# Patient Record
Sex: Female | Born: 1975 | Race: White | Hispanic: No | Marital: Married | Smoking: Former smoker
Health system: Southern US, Community
[De-identification: ages and names within clinical notes are randomized; demographics above are authoritative.]

## PROBLEM LIST (undated history)

## (undated) DIAGNOSIS — N289 Disorder of kidney and ureter, unspecified: Secondary | ICD-10-CM

## (undated) DIAGNOSIS — C50919 Malignant neoplasm of unspecified site of unspecified female breast: Secondary | ICD-10-CM

## (undated) DIAGNOSIS — G43909 Migraine, unspecified, not intractable, without status migrainosus: Secondary | ICD-10-CM

## (undated) HISTORY — PX: APPENDECTOMY: SHX54

## (undated) HISTORY — PX: INCONTINENCE SURGERY: SHX676

## (undated) HISTORY — PX: ABDOMINAL HYSTERECTOMY: SHX81

## (undated) HISTORY — PX: BREAST SURGERY: SHX581

---

## 2004-07-24 ENCOUNTER — Inpatient Hospital Stay: Payer: Self-pay | Admitting: Urology

## 2006-05-12 ENCOUNTER — Ambulatory Visit: Payer: Self-pay | Admitting: Urology

## 2006-05-13 ENCOUNTER — Ambulatory Visit: Payer: Self-pay | Admitting: Urology

## 2006-06-23 ENCOUNTER — Ambulatory Visit: Payer: Self-pay | Admitting: Unknown Physician Specialty

## 2006-07-03 ENCOUNTER — Ambulatory Visit: Payer: Self-pay | Admitting: Unknown Physician Specialty

## 2007-09-01 ENCOUNTER — Emergency Department: Payer: Self-pay | Admitting: Internal Medicine

## 2008-08-16 ENCOUNTER — Ambulatory Visit: Payer: Self-pay | Admitting: Unknown Physician Specialty

## 2013-02-03 DIAGNOSIS — C50919 Malignant neoplasm of unspecified site of unspecified female breast: Secondary | ICD-10-CM | POA: Insufficient documentation

## 2014-07-14 ENCOUNTER — Emergency Department
Admission: EM | Admit: 2014-07-14 | Discharge: 2014-07-14 | Disposition: A | Discharge status: Home / Self Care | Payer: 59 | Source: Home / Self Care | Attending: Emergency Medicine | Admitting: Emergency Medicine

## 2014-07-14 ENCOUNTER — Encounter: Payer: Self-pay | Admitting: Emergency Medicine

## 2014-07-14 DIAGNOSIS — N12 Tubulo-interstitial nephritis, not specified as acute or chronic: Secondary | ICD-10-CM

## 2014-07-14 DIAGNOSIS — N23 Unspecified renal colic: Secondary | ICD-10-CM

## 2014-07-14 DIAGNOSIS — R319 Hematuria, unspecified: Secondary | ICD-10-CM | POA: Diagnosis not present

## 2014-07-14 HISTORY — DX: Malignant neoplasm of unspecified site of unspecified female breast: C50.919

## 2014-07-14 HISTORY — DX: Disorder of kidney and ureter, unspecified: N28.9

## 2014-07-14 LAB — POCT URINALYSIS DIP (MANUAL ENTRY)
Bilirubin, UA: NEGATIVE
Glucose, UA: NEGATIVE
Nitrite, UA: POSITIVE
Protein Ur, POC: 100
Spec Grav, UA: >=1.03 (ref 1.005–1.03)
Urobilinogen, UA: 0.2 (ref 0–1)
pH, UA: 5.5 (ref 5–8)

## 2014-07-14 MED ORDER — HYDROCODONE-ACETAMINOPHEN 5-325 MG PO TABS: 1.0000 | ORAL_TABLET | ORAL | Status: DC | PRN

## 2014-07-14 MED ORDER — CEFTRIAXONE SODIUM 250 MG IJ SOLR
500.0000 mg | Freq: Once | INTRAMUSCULAR | Status: AC
  Administered 2014-07-14: 500 mg via INTRAMUSCULAR

## 2014-07-14 MED ORDER — CIPROFLOXACIN HCL 500 MG PO TABS: 500.0000 mg | ORAL_TABLET | Freq: Two times a day (BID) | ORAL | Status: DC

## 2014-07-14 NOTE — ED Notes (Signed)
Hematuria, clots, LBP started about 2am

## 2014-07-14 NOTE — Discharge Instructions (Signed)
Pyelonephritis, Adult Pyelonephritis is a kidney infection. In general, there are 2 main types of pyelonephritis:  Infections that come on quickly without any warning (acute pyelonephritis).  Infections that persist for a long period of time (chronic pyelonephritis). CAUSES  Two main causes of pyelonephritis are:  Bacteria traveling from the bladder to the kidney. This is a problem especially in pregnant women. The urine in the bladder can become filled with bacteria from multiple causes, including:  Inflammation of the prostate gland (prostatitis).  Sexual intercourse in females.  Bladder infection (cystitis).  Bacteria traveling from the bloodstream to the tissue part of the kidney. Problems that may increase your risk of getting a kidney infection include:  Diabetes.  Kidney stones or bladder stones.  Cancer.  Catheters placed in the bladder.  Other abnormalities of the kidney or ureter. SYMPTOMS   Abdominal pain.  Pain in the side or flank area.  Fever.  Chills.  Upset stomach.  Blood in the urine (dark urine).  Frequent urination.  Strong or persistent urge to urinate.  Burning or stinging when urinating. DIAGNOSIS  Your caregiver may diagnose your kidney infection based on your symptoms. A urine sample may also be taken. TREATMENT  In general, treatment depends on how severe the infection is.   If the infection is mild and caught early, your caregiver may treat you with oral antibiotics and send you home.  If the infection is more severe, the bacteria may have gotten into the bloodstream. This will require intravenous (IV) antibiotics and a hospital stay. Symptoms may include:  High fever.  Severe flank pain.  Shaking chills.  Even after a hospital stay, your caregiver may require you to be on oral antibiotics for a period of time.  Other treatments may be required depending upon the cause of the infection. HOME CARE INSTRUCTIONS   Take your  antibiotics as directed. Finish them even if you start to feel better.  Make an appointment to have your urine checked to make sure the infection is gone.  Drink enough fluids to keep your urine clear or pale yellow.  Take medicines for the bladder if you have urgency and frequency of urination as directed by your caregiver. SEEK IMMEDIATE MEDICAL CARE IF:   You have a fever or persistent symptoms for more than 2-3 days.  You have a fever and your symptoms suddenly get worse.  You are unable to take your antibiotics or fluids.  You develop shaking chills.  You experience extreme weakness or fainting.  There is no improvement after 2 days of treatment. MAKE SURE YOU:  Understand these instructions.  Will watch your condition.  Will get help right away if you are not doing well or get worse. Document Released: 04/01/2005 Document Revised: 10/01/2011 Document Reviewed: 09/05/2010 [facility] Patient Information 2015 [place], [place]. This information is not intended to replace advice given to you by your health care provider. Make sure you discuss any questions you have with your health care provider.  Today, we treated with Rocephin (antibiotic) 500 mg shot. New Prescriptions   CIPROFLOXACIN (CIPRO) 500 MG TABLET    Take 1 tablet (500 mg total) by mouth 2 (two) times daily. Take for 10 days   HYDROCODONE-ACETAMINOPHEN (NORCO/VICODIN) 5-325 MG PER TABLET    Take 1-2 tablets by mouth every 4 (four) hours as needed for severe pain. Take with food.   You need to follow up with your urologist in 1-2 days. If any severe worsening symptoms, go to emergency room  immediately.

## 2014-07-14 NOTE — ED Provider Notes (Addendum)
CSN: [id number]     Arrival date & time 07/14/14  1627 History   First MD Initiated Contact with Patient 07/14/14 1656     Chief Complaint  Patient presents with  . Hematuria    HPI This is a 39 y.o. female who presents today with UTI symptoms for 1 day. + dysuria + frequency + urgency Positive hematuria No vaginal discharge Positive for mild fever/chills Positive for moderate right flank pain and right lower quadrant and suprapubic pain . No nausea No vomiting No midline back pain No fatigue She denies chance of pregnancy.--Status post hysterectomy 10 years ago Has tried over-the-counter AZO  without improvement.  She has a long history of recurrent UTIs for 20 years with negative urology workup in the past. Last time she saw urologist was at [facility] in [place] about 4 years ago.  She is able to tolerate by mouth's.    Past Medical History  Diagnosis Date  . Renal disorder   . Breast cancer    Past Surgical History  Procedure Laterality Date  . Breast surgery     History reviewed. No pertinent family history. History  Substance Use Topics  . Smoking status: Former Research scientist (life sciences)  . Smokeless tobacco: Not on file  . Alcohol Use: Yes   OB History    No data available     Review of Systems  All other systems reviewed and are negative.   Allergies  Penicillins and Sulfa antibiotics  Home Medications   Prior to Admission medications   Medication Sig Start Date End Date Taking? Authorizing Provider  tamoxifen (NOLVADEX) 20 MG tablet Take 20 mg by mouth daily.   Yes Historical Provider, MD  ciprofloxacin (CIPRO) 500 MG tablet Take 1 tablet (500 mg total) by mouth 2 (two) times daily. Take for 10 days 07/14/14   Jacqulyn Cane, MD  HYDROcodone-acetaminophen (NORCO/VICODIN) 5-325 MG per tablet Take 1-2 tablets by mouth every 4 (four) hours as needed for severe pain. Take with food. 07/14/14   Jacqulyn Cane, MD   BP 122/80 mmHg  Pulse 85  Temp(Src) 98 F (36.7  C) (Oral)  Ht 5\' 4"  (1.626 m)  Wt 140 lb (63.504 kg)  BMI 24.02 kg/m2  SpO2 99% Physical Exam  Constitutional: She is oriented to person, place, and time. She appears well-developed and well-nourished. No distress.  Alert female, cooperative. Mildly uncomfortable but in no acute distress  HENT:  Mouth/Throat: Oropharynx is clear and moist.  Eyes: Pupils are equal, round, and reactive to light. No scleral icterus.  Neck: Neck supple.  Cardiovascular: Normal rate, regular rhythm and normal heart sounds.   Pulmonary/Chest: Breath sounds normal.  Abdominal: Soft. Bowel sounds are normal. She exhibits no distension and no mass. There is no hepatosplenomegaly or hepatomegaly. There is tenderness in the suprapubic area. There is CVA tenderness (Right). There is no rigidity, no rebound, no guarding, no tenderness at McBurney's point and negative Murphy's sign.  Musculoskeletal:  Extremities without cyanosis clubbing or edema  Lymphadenopathy:    She has no cervical adenopathy.  Neurological: She is alert and oriented to person, place, and time.  Skin: Skin is warm and dry. No rash noted. She is not diaphoretic.  Nursing note and vitals reviewed.   ED Course  Procedures (including critical care time) Labs Review Labs Reviewed  URINE CULTURE  POCT URINALYSIS DIP (MANUAL ENTRY)   Results for orders placed or performed during the hospital encounter of 07/14/14  POCT urinalysis dipstick (new)  Result Value  Ref Range   Color, UA yellow    Clarity, UA hazy    Glucose, UA neg    Bilirubin, UA negative    Bilirubin, UA trace (5)    Spec Grav, UA >=1.030 1.005 - 1.03   Blood, UA large    pH, UA 5.5 5 - 8   Protein Ur, POC =100    Urobilinogen, UA 0.2 0 - 1   Nitrite, UA Positive    Leukocytes, UA small (1+)      Imaging Review No results found.   MDM   1. Pyelonephritis   2. Hematuria   3. Renal colic on right side    right pyelonephritis. She has stable vital signs and no  clinical evidence of sepsis and in my opinion can be treated as an outpatient with very close follow-up within 1-2 days with her urologist.  I'm avoiding Flomax, because of potential for severe side effects in someone who is allergic to sulfa.  Treatment options discussed, as well as risks, benefits, alternatives. Patient voiced understanding and agreement with the following plans: Carefully reviewed that she has taken Keflex and other cephalosporins over the years without any side effects whatsoever.--(She has a history of rash from penicillin as a child) Rocephin 500 mg IM stat. He was observed and monitored closely for 30 minutes without any side effects. Urine strainer provided and advised to strain all urines. Push clear liquids New Prescriptions   CIPROFLOXACIN (CIPRO) 500 MG TABLET    Take 1 tablet (500 mg total) by mouth 2 (two) times daily. Take for 10 days   HYDROCODONE-ACETAMINOPHEN (NORCO/VICODIN) 5-325 MG PER TABLET    Take 1-2 tablets by mouth every 4 (four) hours as needed for severe pain. Take with food.   Urine culture sent. He declined any imaging or other testing at this time. Follow-up with your urologist in 1-2 days , or sooner if symptoms become worse. Precautions discussed. Red flags discussed.--Emergency room if any red flag Questions invited and answered. Patient voiced understanding and agreement. An After Visit Summary was printed and given to the patient.   Jacqulyn Cane, MD 07/14/14 1711  Jacqulyn Cane, MD 07/14/14 [phone]

## 2014-07-17 LAB — URINE CULTURE: Colony Count: >=100000

## 2014-07-18 ENCOUNTER — Telehealth: Payer: Self-pay | Admitting: Emergency Medicine

## 2014-10-08 ENCOUNTER — Encounter: Payer: Self-pay | Admitting: Emergency Medicine

## 2014-10-08 ENCOUNTER — Emergency Department (INDEPENDENT_AMBULATORY_CARE_PROVIDER_SITE_OTHER): Payer: 59

## 2014-10-08 ENCOUNTER — Emergency Department
Admission: EM | Admit: 2014-10-08 | Discharge: 2014-10-08 | Disposition: A | Discharge status: Home / Self Care | Payer: 59 | Source: Home / Self Care | Attending: Family Medicine | Admitting: Family Medicine

## 2014-10-08 DIAGNOSIS — X58XXXA Exposure to other specified factors, initial encounter: Secondary | ICD-10-CM

## 2014-10-08 DIAGNOSIS — S93402A Sprain of unspecified ligament of left ankle, initial encounter: Secondary | ICD-10-CM

## 2014-10-08 DIAGNOSIS — S92352A Displaced fracture of fifth metatarsal bone, left foot, initial encounter for closed fracture: Secondary | ICD-10-CM

## 2014-10-08 DIAGNOSIS — S92355A Nondisplaced fracture of fifth metatarsal bone, left foot, initial encounter for closed fracture: Secondary | ICD-10-CM

## 2014-10-08 MED ORDER — MELOXICAM 15 MG PO TABS: 15.0000 mg | ORAL_TABLET | Freq: Every day | ORAL | Status: DC

## 2014-10-08 MED ORDER — HYDROCODONE-ACETAMINOPHEN 5-325 MG PO TABS: 1.0000 | ORAL_TABLET | ORAL | Status: DC | PRN

## 2014-10-08 MED ORDER — KETOROLAC TROMETHAMINE 30 MG/ML IJ SOLN
60.0000 mg | Freq: Once | INTRAMUSCULAR | Status: AC
  Administered 2014-10-08: 60 mg via INTRAMUSCULAR

## 2014-10-08 MED ORDER — KETOROLAC TROMETHAMINE 30 MG/ML IJ SOLN: 60.0000 mg | Freq: Once | INTRAMUSCULAR | Status: DC

## 2014-10-08 NOTE — Discharge Instructions (Signed)
Apply ice pack for 30 minutes every 1 to 2 hours today and tomorrow.  Wear ace wrap to minimize swelling.  Elevate as much as possible.  Use crutches until evaluated by sports medicine physician. Wear cam walker when walking.  Recommend taking 600units vitamin D and 1000mg  calcium daily.

## 2014-10-08 NOTE — ED Notes (Signed)
Patient states she stepped into a hole today and twisted the left ankle. Pain 10/10 sharp with movement and aching pain. Edema noted.

## 2014-10-08 NOTE — ED Provider Notes (Signed)
CSN: [id number]     Arrival date & time 10/08/14  1133 History   First MD Initiated Contact with Patient 10/08/14 1205     Chief Complaint  Patient presents with  . Ankle Pain     HPI Comments: Approximately two hours prior while playing golf, patient stepped into a hole and twisted her left ankle and foot.  She heard/felt a "pop" and had immediate pain/swelling.  She has pain with weight bearing.  Patient is a 39 y.o. female presenting with ankle pain. The history is provided by the patient and the spouse.  Ankle Pain Location:  Foot and ankle Time since incident:  2 hours Injury: yes   Mechanism of injury comment:  Inverted ankle Ankle location:  L ankle Foot location:  L foot Pain details:    Quality:  Aching   Radiates to:  Does not radiate   Severity:  Moderate   Onset quality:  Sudden   Duration:  2 hours   Timing:  Constant   Progression:  Unchanged Chronicity:  New Prior injury to area:  No Relieved by:  None tried Worsened by:  Bearing weight Ineffective treatments:  Ice Associated symptoms: decreased ROM, stiffness and swelling   Associated symptoms: no back pain, no muscle weakness, no numbness and no tingling     Past Medical History  Diagnosis Date  . Renal disorder   . Breast cancer    Past Surgical History  Procedure Laterality Date  . Breast surgery    . Appendectomy    . Abdominal hysterectomy    . Incontinence surgery     History reviewed. No pertinent family history. History  Substance Use Topics  . Smoking status: Former Research scientist (life sciences)  . Smokeless tobacco: Not on file  . Alcohol Use: Yes   OB History    No data available     Review of Systems  Musculoskeletal: Positive for stiffness. Negative for back pain.  All other systems reviewed and are negative.   Allergies  Bactrim; Penicillins; and Sulfa antibiotics  Home Medications   Prior to Admission medications   Medication Sig Start Date End Date Taking? Authorizing Provider    ciprofloxacin (CIPRO) 500 MG tablet Take 1 tablet (500 mg total) by mouth 2 (two) times daily. Take for 10 days 07/14/14   Jacqulyn Cane, MD  HYDROcodone-acetaminophen (NORCO/VICODIN) 5-325 MG per tablet Take 1-2 tablets by mouth every 4 (four) hours as needed for severe pain. Take with food. 10/08/14   Kandra Nicolas, MD  meloxicam (MOBIC) 15 MG tablet Take 1 tablet (15 mg total) by mouth daily. Take with food each morning 10/08/14   Kandra Nicolas, MD  tamoxifen (NOLVADEX) 20 MG tablet Take 20 mg by mouth daily.    Historical Provider, MD   BP 129/85 mmHg  Pulse 86  Temp(Src) 98.6 F (37 C) (Oral)  Resp 16  Ht 5\' 4"  (1.626 m)  SpO2 99% Physical Exam  Constitutional: She is oriented to person, place, and time. She appears well-developed and well-nourished. No distress.  HENT:  Head: Normocephalic.  Eyes: Pupils are equal, round, and reactive to light.  Musculoskeletal:       Left ankle: She exhibits decreased range of motion and swelling. She exhibits no ecchymosis, no deformity, no laceration and normal pulse. Tenderness. Lateral malleolus and AITFL tenderness found. No medial malleolus, no CF ligament, no posterior TFL, no head of 5th metatarsal and no proximal fibula tenderness found. Achilles tendon normal.  Feet:  Left ankle:  Decreased range of motion.  Tenderness and swelling over the lateral malleolus.  Joint stable.  There is tenderness to palpation over the distal fifth metatarsal. Distal neurovascular function is intact.  There is also tenderness over the course of the left peroneal tendon and base of 5th metatarsal.  Pain is elicited with resisted eversion and resisted plantar flexion of the ankle.  Distal neurovascular function is intact.     Neurological: She is alert and oriented to person, place, and time.  Skin: Skin is warm and dry.  Nursing note and vitals reviewed.   ED Course  Procedures  none  Imaging Review Dg Ankle Complete Left  10/08/2014   CLINICAL  DATA:  Left ankle injury, acute pain, difficulty bearing weight. Next illness  EXAM: LEFT ANKLE COMPLETE - 3+ VIEW  COMPARISON:  None.  FINDINGS: Normal left ankle alignment. Distal tibia, fibula, talus and calcaneus appear intact.  Lateral foot soft tissue swelling evident. Oblique lucency through the left fifth metatarsal distally is evident suspicious for a nondisplaced fracture. Recommend dedicated left foot series for further evaluation. Other metatarsals appear intact.  IMPRESSION: Findings suspicious for a nondisplaced acute fracture of the left fifth metatarsal distally, incompletely imaged by this ankle exam. Recommend dedicated left foot series.  No acute osseous finding at the ankle.   Electronically Signed   By: Jerilynn Mages.  Shick M.D.   On: 10/08/2014 12:20   Dg Foot Complete Left  10/08/2014   CLINICAL DATA:  Stepped in hole along golf course injuring left ankle. Difficulty bearing weight with lateral pain.  EXAM: LEFT FOOT - COMPLETE 3+ VIEW  COMPARISON:  None.  FINDINGS: Examination demonstrates a very minimally displaced oblique fracture of the distal aspect of the fifth metatarsal. Remainder of the exam is within normal.  IMPRESSION: Minimally displaced oblique fracture of the distal aspect of the fifth metatarsal.   Electronically Signed   By: Marin Olp M.D.   On: 10/08/2014 13:04     MDM   1. Left ankle sprain, initial encounter   2. Nondisplaced fracture of fifth left metatarsal bone, closed, initial encounter    Toradol 60mg  IM administered.  Ace wrap applied.  Dispensed cam walker and crutches. Rx for Mobic 15mg  daily.  Lortab as needed for acute pain. Apply ice pack for 30 minutes every 1 to 2 hours today and tomorrow.  Wear ace wrap to minimize swelling.  Elevate as much as possible.  Use crutches until evaluated by sports medicine physician. Wear cam walker when walking.  Followup with Dr. Aundria Mems ([facility]) in about 4 to 5 days. Discussed vitamin D and  calcium supplementation.    Kandra Nicolas, MD 10/08/14 1340

## 2014-10-10 ENCOUNTER — Telehealth: Payer: Self-pay | Admitting: *Deleted

## 2014-10-10 NOTE — Telephone Encounter (Signed)
Pt left vm stating that she needs to f/u with you for a fracture and ankle sprain.  She was seen in [facility].  Can you take a look at [facility] notes & her images to see how urgently she needs to f/u? Thanks

## 2014-10-10 NOTE — Telephone Encounter (Signed)
Pt scheduled for 7/12.

## 2014-10-10 NOTE — Telephone Encounter (Signed)
1-2 weeks is fine for that.

## 2014-10-25 ENCOUNTER — Institutional Professional Consult (permissible substitution): Payer: 59 | Admitting: Sports Medicine

## 2015-03-24 ENCOUNTER — Emergency Department
Admission: EM | Admit: 2015-03-24 | Discharge: 2015-03-24 | Disposition: A | Discharge status: Home / Self Care | Payer: 59 | Source: Home / Self Care | Attending: Family Medicine | Admitting: Family Medicine

## 2015-03-24 DIAGNOSIS — R21 Rash and other nonspecific skin eruption: Secondary | ICD-10-CM

## 2015-03-24 MED ORDER — METHYLPREDNISOLONE SODIUM SUCC 125 MG IJ SOLR
80.0000 mg | Freq: Once | INTRAMUSCULAR | Status: AC
  Administered 2015-03-24: 80 mg via INTRAMUSCULAR

## 2015-03-24 MED ORDER — PREDNISONE 20 MG PO TABS: 20.0000 mg | ORAL_TABLET | Freq: Two times a day (BID) | ORAL | Status: DC

## 2015-03-24 NOTE — ED Provider Notes (Signed)
CSN: [id number]     Arrival date & time 03/24/15  [id number] History   First MD Initiated Contact with Patient 03/24/15 1023     Chief Complaint  Patient presents with  . Rash    face      HPI Comments: Patient complains of awakening with "shingles" on her face one week ago, and was prescribed Valtrex.  She had a pruritic rash on her cheeks, worse on the right, and forehead without vesicles.  She also had macular erythema on shoulders and upper chest.  She states that she had rash on her nose, but denies eye irritation or redness.  She states that she has not improved while taking Valtrex.  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  She feels well otherwise.   Patient is a 39 y.o. female presenting with rash. The history is provided by the patient.  Rash Location:  Face Facial rash location:  Forehead, L cheek, R cheek and nose Quality: itchiness, redness and swelling   Quality: not blistering, not burning, not draining, not painful, not peeling, not scaling and not weeping   Severity:  Mild Onset quality:  Sudden Duration:  1 week Timing:  Constant Progression:  Unchanged Chronicity:  Recurrent Context: not animal contact, not chemical exposure, not exposure to similar rash, not food, not hot tub use, not insect bite/sting, not medications, not new detergent/soap, not plant contact, not sick contacts and not sun exposure   Relieved by:  Nothing Worsened by:  Nothing tried Ineffective treatments: Valtrex. Associated symptoms: periorbital edema   Associated symptoms: no abdominal pain, no fatigue, no fever, no headaches, no induration, no joint pain, no myalgias, no nausea, no shortness of breath, no sore throat and no URI     Past Medical History  Diagnosis Date  . Renal disorder   . Breast cancer [facility])    Past Surgical History  Procedure Laterality Date  . Breast surgery    . Appendectomy    . Abdominal hysterectomy    . Incontinence surgery      Family History  Problem Relation Age of Onset  . Cancer Father    Social History  Substance Use Topics  . Smoking status: Former Research scientist (life sciences)  . Smokeless tobacco: None  . Alcohol Use: Yes   OB History    No data available     Review of Systems  Constitutional: Negative for fever and fatigue.  HENT: Negative for sore throat.   Eyes: Negative for photophobia, pain, discharge, redness and itching.  Respiratory: Negative for shortness of breath.   Gastrointestinal: Negative for nausea and abdominal pain.  Musculoskeletal: Negative for myalgias and arthralgias.  Skin: Positive for rash.  Neurological: Negative for headaches.  All other systems reviewed and are negative.   Allergies  Bactrim; Penicillins; and Sulfa antibiotics  Home Medications   Prior to Admission medications   Medication Sig Start Date End Date Taking? Authorizing Provider  valACYclovir (VALTREX) 500 MG tablet Take 500 mg by mouth 2 (two) times daily.   Yes Historical Provider, MD  HYDROcodone-acetaminophen (NORCO/VICODIN) 5-325 MG per tablet Take 1-2 tablets by mouth every 4 (four) hours as needed for severe pain. Take with food. 10/08/14   Kandra Nicolas, MD  meloxicam (MOBIC) 15 MG tablet Take 1 tablet (15 mg total) by mouth daily. Take with food each morning 10/08/14   Kandra Nicolas, MD  predniSONE (DELTASONE) 20 MG tablet Take 1 tablet (20 mg total) by mouth 2 (  two) times daily. Take with food.  Begin Saturday 03/25/15. 03/24/15   Kandra Nicolas, MD  tamoxifen (NOLVADEX) 20 MG tablet Take 20 mg by mouth daily.    Historical Provider, MD   Meds Ordered and Administered this Visit   Medications  methylPREDNISolone sodium succinate (SOLU-MEDROL) 125 mg/2 mL injection 80 mg (not administered)    BP 114/72 mmHg  Pulse 96  Temp(Src) 98.2 F (36.8 C) (Oral)  Ht 5' 4.5" (1.638 m)  Wt 148 lb 8 oz (67.359 kg)  BMI 25.11 kg/m2  SpO2 98% No data found.   Physical Exam  Constitutional: She is oriented to  person, place, and time. She appears well-developed and well-nourished. No distress.  HENT:  Head: Normocephalic. Head is without right periorbital erythema and without left periorbital erythema.    Right Ear: External ear normal.  Left Ear: External ear normal.  Nose: Nose normal.  Mouth/Throat: Oropharynx is clear and moist.  Cheeks and forehead have macular erythematous eruption without swelling, warmth, or tenderness to palpation.  Rash is more prominent on right cheek.   Eyes: Conjunctivae and EOM are normal. Pupils are equal, round, and reactive to light. Right eye exhibits no discharge. Left eye exhibits no discharge.  Neck: Neck supple.  Cardiovascular: Normal heart sounds.   Pulmonary/Chest: Breath sounds normal.  Lymphadenopathy:    She has no cervical adenopathy.  Neurological: She is alert and oriented to person, place, and time.  Skin: Skin is warm and dry.  Nursing note and vitals reviewed.   ED Course  Procedures    MDM   1. Rash and nonspecific skin eruption; doubt herpes zoster.  ?Contact dermatitis.  ?Rosacea    Solumedrol 80mg  IM.  Begin prednisone burst tomorrow. May take non-sedating antihistamine such as Zyrtec daytime for itching. Followup with dermatologist if not resolved one week.    Kandra Nicolas, MD 03/24/15 1055

## 2015-03-24 NOTE — ED Notes (Signed)
Pt stated that she needed to leave for work.  She stated that she felt fine after her shot, and she would be fine.

## 2015-03-24 NOTE — Discharge Instructions (Signed)
May take non-sedating antihistamine such as Zyrtec daytime for itching.

## 2015-03-24 NOTE — ED Notes (Signed)
Went to [facility] last Friday, Dec 2, and was diagnosed with shingles.  Started on Valtrex last Friday, seemed to get better, but now worse. Itchy,red rash around eyes and cheeks.

## 2015-04-17 ENCOUNTER — Emergency Department (INDEPENDENT_AMBULATORY_CARE_PROVIDER_SITE_OTHER)
Admission: EM | Admit: 2015-04-17 | Discharge: 2015-04-17 | Disposition: A | Discharge status: Home / Self Care | Payer: BLUE CROSS/BLUE SHIELD | Source: Home / Self Care | Attending: Emergency Medicine | Admitting: Emergency Medicine

## 2015-04-17 ENCOUNTER — Encounter: Payer: Self-pay | Admitting: *Deleted

## 2015-04-17 DIAGNOSIS — H65111 Acute and subacute allergic otitis media (mucoid) (sanguinous) (serous), right ear: Secondary | ICD-10-CM

## 2015-04-17 DIAGNOSIS — J209 Acute bronchitis, unspecified: Secondary | ICD-10-CM

## 2015-04-17 DIAGNOSIS — J0101 Acute recurrent maxillary sinusitis: Secondary | ICD-10-CM

## 2015-04-17 MED ORDER — PREDNISONE 10 MG (21) PO TBPK: ORAL_TABLET | ORAL | Status: DC

## 2015-04-17 MED ORDER — FLUTICASONE PROPIONATE 50 MCG/ACT NA SUSP: NASAL | Status: DC

## 2015-04-17 MED ORDER — BENZONATATE 200 MG PO CAPS: ORAL_CAPSULE | ORAL | Status: DC

## 2015-04-17 MED ORDER — PROMETHAZINE-CODEINE 6.25-10 MG/5ML PO SYRP: ORAL_SOLUTION | ORAL | Status: DC

## 2015-04-17 MED ORDER — CEFDINIR 300 MG PO CAPS: 300.0000 mg | ORAL_CAPSULE | Freq: Two times a day (BID) | ORAL | Status: DC

## 2015-04-17 NOTE — ED Notes (Signed)
Pt c/o 4 days of cough, nasal congestion sore throat and right ear pain.

## 2015-04-17 NOTE — ED Provider Notes (Signed)
CSN: [id number]     Arrival date & time 04/17/15  1127 History   First MD Initiated Contact with Patient 04/17/15 1246     Chief Complaint  Patient presents with  . Nasal Congestion  . Cough   (Consider location/radiation/quality/duration/timing/severity/associated sxs/prior Treatment) HPI URI HISTORY  Patricia Humphrey is a 40 y.o. female who complains of onset of cold symptoms for several days.  Have been using over-the-counter treatment which helps a little bit.  No chills/sweats +  Fever  +  Nasal congestion +  Discolored Post-nasal drainage + sinus pain/pressure No sore throat  +  cough No wheezing No chest congestion No hemoptysis No shortness of breath No pleuritic pain  No itchy/red eyes + R earache  No nausea No vomiting No abdominal pain No diarrhea  No skin rashes +  Fatigue No myalgias No headache   Past Medical History  Diagnosis Date  . Renal disorder   . Breast cancer [facility])    Past Surgical History  Procedure Laterality Date  . Breast surgery    . Appendectomy    . Abdominal hysterectomy    . Incontinence surgery     Family History  Problem Relation Age of Onset  . Cancer Father    Social History  Substance Use Topics  . Smoking status: Former Research scientist (life sciences)  . Smokeless tobacco: None  . Alcohol Use: Yes   OB History    No data available     Review of Systems  All other systems reviewed and are negative.   Allergies  Bactrim; Penicillins; and Sulfa antibiotics  Home Medications   Prior to Admission medications   Medication Sig Start Date End Date Taking? Authorizing Provider  benzonatate (TESSALON) 200 MG capsule Take 1 every 8 hours as needed for cough. 04/17/15   Jacqulyn Cane, MD  cefdinir (OMNICEF) 300 MG capsule Take 1 capsule (300 mg total) by mouth 2 (two) times daily. X 10 days 04/17/15   Jacqulyn Cane, MD  fluticasone [facility]) 50 MCG/ACT nasal spray 1 or 2 sprays each nostril twice a day 04/17/15   Jacqulyn Cane, MD  predniSONE (STERAPRED  UNI-PAK 21 TAB) 10 MG (21) TBPK tablet Take as directed for 6 days.--Take 6 on day 1, 5 on day 2, 4 on day 3, then 3 tablets on day 4, then 2 tablets on day 5, then 1 on day 6. 04/17/15   Jacqulyn Cane, MD  promethazine-codeine [facility] WITH CODEINE) 6.25-10 MG/5ML syrup Take 1-2 teaspoons every 4-6 hours as needed for cough. May cause drowsiness. 04/17/15   Jacqulyn Cane, MD  tamoxifen (NOLVADEX) 20 MG tablet Take 20 mg by mouth daily.    Historical Provider, MD   Meds Ordered and Administered this Visit  Medications - No data to display  BP 129/86 mmHg  Pulse 79  Temp(Src) 98.2 F (36.8 C) (Oral)  Resp 16  Wt 147 lb (66.679 kg)  SpO2 99% No data found.   Physical Exam  Constitutional: She is oriented to person, place, and time. She appears well-developed and well-nourished. No distress.  HENT:  Head: Normocephalic and atraumatic.  Right Ear: External ear and ear canal normal. Tympanic membrane is erythematous and retracted. Tympanic membrane is not perforated. Tympanic membrane mobility is abnormal.  Left Ear: Tympanic membrane, external ear and ear canal normal.  Nose: Mucosal edema and rhinorrhea present. Right sinus exhibits maxillary sinus tenderness. Left sinus exhibits maxillary sinus tenderness.  Mouth/Throat: Oropharynx is clear and moist. No oral lesions. No oropharyngeal exudate.  Eyes:  Right eye exhibits no discharge. Left eye exhibits no discharge. No scleral icterus.  Neck: Neck supple.  Cardiovascular: Normal rate, regular rhythm and normal heart sounds.   Pulmonary/Chest: Effort normal. She has no decreased breath sounds. She has wheezes (Rare late expiratory wheezes bilaterally). She has rhonchi. She has no rales.  Lymphadenopathy:    She has no cervical adenopathy.  Neurological: She is alert and oriented to person, place, and time.  Skin: Skin is warm and dry.  Nursing note and vitals reviewed.   ED Course  Procedures (including critical care time)  Labs  Review Labs Reviewed - No data to display  Imaging Review No results found.   Visual Acuity Review  Right Eye Distance:   Left Eye Distance:   Bilateral Distance:    Right Eye Near:   Left Eye Near:    Bilateral Near:       Oxygen saturation 99% on room air  MDM  Minimal bronchospasm, few wheezes on forced expiration, but good air movement bilaterally. 1. Acute bronchitis with bronchospasm   2. Acute mucoid otitis media of right ear   3. Acute recurrent maxillary sinusitis    Treatment options discussed, as well as risks, benefits, alternatives. Patient voiced understanding and agreement with the following plans:   New Prescriptions   BENZONATATE (TESSALON) 200 MG CAPSULE    Take 1 every 8 hours as needed for cough.   CEFDINIR (OMNICEF) 300 MG CAPSULE    Take 1 capsule (300 mg total) by mouth 2 (two) times daily. X 10 days   FLUTICASONE (FLONASE) 50 MCG/ACT NASAL SPRAY    1 or 2 sprays each nostril twice a day   PREDNISONE (STERAPRED UNI-PAK 21 TAB) 10 MG (21) TBPK TABLET    Take as directed for 6 days.--Take 6 on day 1, 5 on day 2, 4 on day 3, then 3 tablets on day 4, then 2 tablets on day 5, then 1 on day 6.   PROMETHAZINE-CODEINE (PHENERGAN WITH CODEINE) 6.25-10 MG/5ML SYRUP    Take 1-2 teaspoons every 4-6 hours as needed for cough. May cause drowsiness.   Follow-up with your primary care doctor in 5-7 days if not improving, or sooner if symptoms become worse. Precautions discussed. Red flags discussed. Questions invited and answered. Patient voiced understanding and agreement.    Jacqulyn Cane, MD 04/17/15 [phone]

## 2015-06-26 DIAGNOSIS — M1712 Unilateral primary osteoarthritis, left knee: Secondary | ICD-10-CM | POA: Insufficient documentation

## 2015-06-28 ENCOUNTER — Emergency Department (INDEPENDENT_AMBULATORY_CARE_PROVIDER_SITE_OTHER)
Admission: EM | Admit: 2015-06-28 | Discharge: 2015-06-28 | Disposition: A | Discharge status: Home / Self Care | Payer: BLUE CROSS/BLUE SHIELD | Source: Home / Self Care | Attending: Family Medicine | Admitting: Family Medicine

## 2015-06-28 ENCOUNTER — Encounter: Payer: Self-pay | Admitting: *Deleted

## 2015-06-28 DIAGNOSIS — R21 Rash and other nonspecific skin eruption: Secondary | ICD-10-CM | POA: Diagnosis not present

## 2015-06-28 MED ORDER — METRONIDAZOLE 0.75 % EX CREA: TOPICAL_CREAM | Freq: Two times a day (BID) | CUTANEOUS | Status: DC

## 2015-06-28 MED ORDER — PREDNISONE 20 MG PO TABS: ORAL_TABLET | ORAL | Status: DC

## 2015-06-28 NOTE — ED Notes (Signed)
Pt c/o facial redness, HA, and jaw pain x this AM. She reports having a cortisone injection in her knee 06/26/15. She denies any new lotions or make up.

## 2015-06-28 NOTE — ED Provider Notes (Signed)
CSN: [id number]     Arrival date & time 06/28/15  [id number] History   First MD Initiated Contact with Patient 06/28/15 [phone]     Chief Complaint  Patient presents with  . Rash  . Headache   (Consider location/radiation/quality/duration/timing/severity/associated sxs/prior Treatment) HPI  The pt is a 40yo female presenting to [facility] with c/o sudden onset facial redness, generalized headache and bilateral jaw pain this morning.  She did have a cortisone injection in her Left knee on 06/26/15, denies any other new medications, soaps, lotions, or foods.  Denies recent increased stress. She did not wear makeup yesterday as she was not working.  Denies fever, chills, cough, congestion, or oral swelling. Per medical records, pt had similar rash about 4 months ago.  She was given a prednisone dose pack and symptoms resolved so she never followed up with a PCP or dermatologist.   Past Medical History  Diagnosis Date  . Renal disorder   . Breast cancer [facility])    Past Surgical History  Procedure Laterality Date  . Breast surgery    . Appendectomy    . Abdominal hysterectomy    . Incontinence surgery     Family History  Problem Relation Age of Onset  . Cancer Father    Social History  Substance Use Topics  . Smoking status: Former Research scientist (life sciences)  . Smokeless tobacco: None  . Alcohol Use: Yes   OB History    No data available     Review of Systems  Constitutional: Negative for fever and chills.  HENT: Positive for ear pain. Negative for congestion, facial swelling, sore throat and voice change.   Respiratory: Negative for cough, chest tightness, shortness of breath and wheezing.   Gastrointestinal: Negative for nausea, vomiting and diarrhea.  Musculoskeletal: Negative for myalgias and arthralgias.  Skin: Positive for rash. Negative for wound.  Neurological: Positive for headaches. Negative for dizziness and light-headedness.    Allergies  Bactrim; Penicillins; and Sulfa antibiotics  Home  Medications   Prior to Admission medications   Medication Sig Start Date End Date Taking? Authorizing Provider  metroNIDAZOLE (METROCREAM) 0.75 % cream Apply topically 2 (two) times daily. After washing face 06/28/15   Noland Fordyce, PA-C  predniSONE (DELTASONE) 20 MG tablet 3 tabs po day one, then 2 po daily x 4 days 06/28/15   Noland Fordyce, PA-C  tamoxifen (NOLVADEX) 20 MG tablet Take 20 mg by mouth daily.    Historical Provider, MD   Meds Ordered and Administered this Visit  Medications - No data to display  BP 127/85 mmHg  Pulse 97  Temp(Src) 98.1 F (36.7 C) (Oral)  Resp 16  Ht 5' 4.5" (1.638 m)  Wt 137 lb (62.143 kg)  BMI 23.16 kg/m2  SpO2 100% No data found.   Physical Exam  Constitutional: She appears well-developed and well-nourished. No distress.  HENT:  Head: Normocephalic and atraumatic.    Right Ear: Tympanic membrane normal.  Left Ear: Tympanic membrane normal.  Nose: Nose normal.  Mouth/Throat: Uvula is midline, oropharynx is clear and moist and mucous membranes are normal.  Rash across bilateral cheeks and nose.  Eyes: Conjunctivae are normal. No scleral icterus.  Neck: Normal range of motion. Neck supple.  Cardiovascular: Normal rate, regular rhythm and normal heart sounds.   Pulmonary/Chest: Effort normal and breath sounds normal. No respiratory distress. She has no wheezes. She has no rales.  Abdominal: Soft. She exhibits no distension. There is no tenderness.  Musculoskeletal: Normal range of motion.  Neurological:  She is alert.  Skin: Skin is warm and dry. Rash noted. She is not diaphoretic. There is erythema.  Facial rash: erythema with warmth over bilateral cheeks and nose. Skin in tact. No bleeding or discharge. No vesicles, fluctuation or induration.   Nursing note and vitals reviewed.   ED Course  Procedures (including critical care time)  Labs Review Labs Reviewed - No data to display  Imaging Review No results found.    MDM   1.  Facial rash    Pt c/o warm erythematous rash that started suddenly this morning after she woke up.  Hx of similar rash 4 months ago that resolved with oral prednisone. No evidence of periorbital cellulitis, shingles, abscess, or anaphylaxis.    Contact dermatitis vs rosacea.  Rash more likely to be rosacea due to recent hx of similar rash.  Rx: prednisone (it has helped in the past), metronidazole cream BID if symptoms not improving with prednisone. Encouraged to f/u with PCP or dermatologist if symptoms not improving, or keep recurring. Patient verbalized understanding and agreement with treatment plan.     Noland Fordyce, PA-C 06/28/15 [phone]

## 2015-07-07 ENCOUNTER — Emergency Department
Admission: EM | Admit: 2015-07-07 | Discharge: 2015-07-07 | Disposition: A | Discharge status: Home / Self Care | Payer: BLUE CROSS/BLUE SHIELD | Source: Home / Self Care | Attending: Family Medicine | Admitting: Family Medicine

## 2015-07-07 ENCOUNTER — Encounter: Payer: Self-pay | Admitting: *Deleted

## 2015-07-07 DIAGNOSIS — J069 Acute upper respiratory infection, unspecified: Secondary | ICD-10-CM | POA: Diagnosis not present

## 2015-07-07 LAB — POCT RAPID STREP A (OFFICE): Rapid Strep A Screen: NEGATIVE

## 2015-07-07 LAB — POCT INFLUENZA A/B
Influenza A, POC: NEGATIVE
Influenza B, POC: NEGATIVE

## 2015-07-07 MED ORDER — BENZONATATE 100 MG PO CAPS: 100.0000 mg | ORAL_CAPSULE | Freq: Three times a day (TID) | ORAL | Status: DC

## 2015-07-07 NOTE — ED Provider Notes (Signed)
CSN: [id number]     Arrival date & time 07/07/15  0900 History   First MD Initiated Contact with Patient 07/07/15 [phone]     Chief Complaint  Patient presents with  . Sore Throat  . Cough   (Consider location/radiation/quality/duration/timing/severity/associated sxs/prior Treatment) HPI  The pt is a 40yo female presenting to [facility] with c/o 2-3 days of URI symptoms including mild to moderate intermittent productive cough, sore throat, and Right ear pain.  Her mother-in-law was sick, then her husband, and now her.  She did get the flu vaccine this season but she is taking Tamoxifin and wants to be checked for Strep and influenza. She has been taking Dayquil OTC with moderate relief. Denies fever, chills, n/v/d.   Pt was seen at [facility] about 2 weeks ago for a rash on her face, she notes this has completely resolved.   Past Medical History  Diagnosis Date  . Renal disorder   . Breast cancer [facility])    Past Surgical History  Procedure Laterality Date  . Breast surgery    . Appendectomy    . Abdominal hysterectomy    . Incontinence surgery     Family History  Problem Relation Age of Onset  . Cancer Father    Social History  Substance Use Topics  . Smoking status: Former Research scientist (life sciences)  . Smokeless tobacco: None  . Alcohol Use: Yes   OB History    No data available     Review of Systems  Constitutional: Positive for diaphoresis ( night sweats). Negative for fever and chills.  HENT: Positive for congestion, ear pain ( Right), rhinorrhea, sore throat and voice change. Negative for sinus pressure, sneezing and trouble swallowing.   Respiratory: Positive for cough and wheezing. Negative for shortness of breath.   Cardiovascular: Negative for chest pain and palpitations.  Gastrointestinal: Negative for nausea, vomiting, abdominal pain and diarrhea.  Musculoskeletal: Positive for myalgias and arthralgias. Negative for back pain.  Skin: Negative for rash.    Allergies  Bactrim; Penicillins; and  Sulfa antibiotics  Home Medications   Prior to Admission medications   Medication Sig Start Date End Date Taking? Authorizing Provider  benzonatate (TESSALON) 100 MG capsule Take 1-2 capsules (100-200 mg total) by mouth every 8 (eight) hours. 07/07/15   Noland Fordyce, PA-C  metroNIDAZOLE (METROCREAM) 0.75 % cream Apply topically 2 (two) times daily. After washing face 06/28/15   Noland Fordyce, PA-C  tamoxifen (NOLVADEX) 20 MG tablet Take 20 mg by mouth daily.    Historical Provider, MD   Meds Ordered and Administered this Visit  Medications - No data to display  BP 103/74 mmHg  Pulse 72  Temp(Src) 98.1 F (36.7 C) (Oral)  Resp 16  Wt 140 lb (63.504 kg)  SpO2 100% No data found.   Physical Exam  Constitutional: She appears well-developed and well-nourished. No distress.  HENT:  Head: Normocephalic and atraumatic.  Right Ear: Tympanic membrane normal.  Left Ear: Tympanic membrane normal.  Nose: Rhinorrhea present. Right sinus exhibits no maxillary sinus tenderness and no frontal sinus tenderness. Left sinus exhibits no maxillary sinus tenderness and no frontal sinus tenderness.  Mouth/Throat: Uvula is midline and mucous membranes are normal. Posterior oropharyngeal erythema present. No oropharyngeal exudate, posterior oropharyngeal edema or tonsillar abscesses.  Eyes: Conjunctivae are normal. No scleral icterus.  Neck: Normal range of motion. Neck supple.  Cardiovascular: Normal rate, regular rhythm and normal heart sounds.   Pulmonary/Chest: Effort normal and breath sounds normal. No stridor. No respiratory distress.  She has no wheezes. She has no rales.  Abdominal: Soft. She exhibits no distension. There is no tenderness.  Musculoskeletal: Normal range of motion.  Lymphadenopathy:    She has no cervical adenopathy.  Neurological: She is alert.  Skin: Skin is warm and dry. She is not diaphoretic.  Nursing note and vitals reviewed.   ED Course  Procedures (including critical  care time)  Labs Review Labs Reviewed  POCT RAPID STREP A (OFFICE)  POCT INFLUENZA A/B    Imaging Review No results found.    MDM   1. Acute upper respiratory infection    Pt c/o URI symptoms for 2-3 days, requesting to be tested for strep and influenza. Pt is afebrile. No respiratory distress.  Lungs: CTAB  Rapid strep and flu: Negative  Reassured pt symptoms are likely viral in nature. Encouraged continued symptomatic treatment. Pt has inhaler at home she can continue to use as needed.  Rx: Tessalon  Advised pt to use acetaminophen and ibuprofen as needed for fever and pain. Encouraged rest and fluids. F/u with PCP in 7-10 days if not improving, sooner if worsening.  Also advised pt she may call [facility] if she develops chest pain, shortness of breath, or 3 days of persistent fever, as Azithromycin could be called in for her.  Pt verbalized understanding and agreement with tx plan.    Noland Fordyce, PA-C 07/07/15 [phone]

## 2015-07-07 NOTE — Discharge Instructions (Signed)
You may take 400-600mg  Ibuprofen (Motrin) every 6-8 hours for fever and pain  Alternate with Tylenol  You may take 500mg  Tylenol every 4-6 hours as needed for fever and pain  Follow-up with your primary care provider next week for recheck of symptoms if not improving.  Be sure to drink plenty of fluids and rest, at least 8hrs of sleep a night, preferably more while you are sick. Return urgent care or go to closest ER if you cannot keep down fluids/signs of dehydration, fever not reducing with Tylenol, difficulty breathing/wheezing, stiff neck, worsening condition, or other concerns (see below)   Your symptoms are likely due to a virus.  Be sure to get plenty of rest.  If your cough continues to worsen and you develop shortness of breath, chest pain, or fever >100.4*F for 3 consecutive days, please call our office and an antibiotic, Azithromycin, can be called in for you, or return for recheck of symptoms.    Cool Mist Vaporizers Vaporizers may help relieve the symptoms of a cough and cold. They add moisture to the air, which helps mucus to become thinner and less sticky. This makes it easier to breathe and cough up secretions. Cool mist vaporizers do not cause serious burns like hot mist vaporizers, which may also be called steamers or humidifiers. Vaporizers have not been proven to help with colds. You should not use a vaporizer if you are allergic to mold. HOME CARE INSTRUCTIONS  Follow the package instructions for the vaporizer.  Do not use anything other than distilled water in the vaporizer.  Do not run the vaporizer all of the time. This can cause mold or bacteria to grow in the vaporizer.  Clean the vaporizer after each time it is used.  Clean and dry the vaporizer well before storing it.  Stop using the vaporizer if worsening respiratory symptoms develop.   This information is not intended to replace advice given to you by your health care provider. Make sure you discuss any  questions you have with your health care provider.   Document Released: 12/28/2003 Document Revised: 04/06/2013 Document Reviewed: 08/19/2012 Elsevier Interactive Patient Education 2016 Elsevier Inc.  Upper Respiratory Infection, Adult Most upper respiratory infections (URIs) are caused by a virus. A URI affects the nose, throat, and upper air passages. The most common type of URI is often called "the common cold." HOME CARE   Take medicines only as told by your doctor.  Gargle warm saltwater or take cough drops to comfort your throat as told by your doctor.  Use a warm mist humidifier or inhale steam from a shower to increase air moisture. This may make it easier to breathe.  Drink enough fluid to keep your pee (urine) clear or pale yellow.  Eat soups and other clear broths.  Have a healthy diet.  Rest as needed.  Go back to work when your fever is gone or your doctor says it is okay.  You may need to stay home longer to avoid giving your URI to others.  You can also wear a face mask and wash your hands often to prevent spread of the virus.  Use your inhaler more if you have asthma.  Do not use any tobacco products, including cigarettes, chewing tobacco, or electronic cigarettes. If you need help quitting, ask your doctor. GET HELP IF:  You are getting worse, not better.  Your symptoms are not helped by medicine.  You have chills.  You are getting more short of breath.  You have brown or red mucus.  You have yellow or brown discharge from your nose.  You have pain in your face, especially when you bend forward.  You have a fever.  You have puffy (swollen) neck glands.  You have pain while swallowing.  You have white areas in the back of your throat. GET HELP RIGHT AWAY IF:   You have very bad or constant:  Headache.  Ear pain.  Pain in your forehead, behind your eyes, and over your cheekbones (sinus pain).  Chest pain.  You have long-lasting  (chronic) lung disease and any of the following:  Wheezing.  Long-lasting cough.  Coughing up blood.  A change in your usual mucus.  You have a stiff neck.  You have changes in your:  Vision.  Hearing.  Thinking.  Mood. MAKE SURE YOU:   Understand these instructions.  Will watch your condition.  Will get help right away if you are not doing well or get worse.   This information is not intended to replace advice given to you by your health care provider. Make sure you discuss any questions you have with your health care provider.   Document Released: 09/18/2007 Document Revised: 08/16/2014 Document Reviewed: 07/07/2013 Elsevier Interactive Patient Education 2016 Elsevier Inc.  Cough, Adult A cough helps to clear your throat and lungs. A cough may last only 2-3 weeks (acute), or it may last longer than 8 weeks (chronic). Many different things can cause a cough. A cough may be a sign of an illness or another medical condition. HOME CARE  Pay attention to any changes in your cough.  Take medicines only as told by your doctor.  If you were prescribed an antibiotic medicine, take it as told by your doctor. Do not stop taking it even if you start to feel better.  Talk with your doctor before you try using a cough medicine.  Drink enough fluid to keep your pee (urine) clear or pale yellow.  If the air is dry, use a cold steam vaporizer or humidifier in your home.  Stay away from things that make you cough at work or at home.  If your cough is worse at night, try using extra pillows to raise your head up higher while you sleep.  Do not smoke, and try not to be around smoke. If you need help quitting, ask your doctor.  Do not have caffeine.  Do not drink alcohol.  Rest as needed. GET HELP IF:  You have new problems (symptoms).  You cough up yellow fluid (pus).  Your cough does not get better after 2-3 weeks, or your cough gets worse.  Medicine does not help  your cough and you are not sleeping well.  You have pain that gets worse or pain that is not helped with medicine.  You have a fever.  You are losing weight and you do not know why.  You have night sweats. GET HELP RIGHT AWAY IF:  You cough up blood.  You have trouble breathing.  Your heartbeat is very fast.   This information is not intended to replace advice given to you by your health care provider. Make sure you discuss any questions you have with your health care provider.   Document Released: 12/13/2010 Document Revised: 12/21/2014 Document Reviewed: 06/08/2014 Elsevier Interactive Patient Education [employer].

## 2015-07-07 NOTE — ED Notes (Signed)
Pt c/o 2-3 days of sore throat, cough, right ear pain. Taken [employer].

## 2015-07-08 LAB — STREP A DNA PROBE: GASP: NOT DETECTED

## 2015-07-10 ENCOUNTER — Telehealth: Payer: Self-pay | Admitting: *Deleted

## 2016-01-02 ENCOUNTER — Emergency Department (INDEPENDENT_AMBULATORY_CARE_PROVIDER_SITE_OTHER): Payer: BLUE CROSS/BLUE SHIELD

## 2016-01-02 ENCOUNTER — Emergency Department
Admission: EM | Admit: 2016-01-02 | Discharge: 2016-01-02 | Disposition: A | Discharge status: Home / Self Care | Payer: BLUE CROSS/BLUE SHIELD | Source: Home / Self Care | Attending: Family Medicine | Admitting: Family Medicine

## 2016-01-02 ENCOUNTER — Encounter: Payer: Self-pay | Admitting: *Deleted

## 2016-01-02 DIAGNOSIS — M533 Sacrococcygeal disorders, not elsewhere classified: Secondary | ICD-10-CM

## 2016-01-02 DIAGNOSIS — S3992XA Unspecified injury of lower back, initial encounter: Secondary | ICD-10-CM | POA: Diagnosis not present

## 2016-01-02 MED ORDER — IBUPROFEN 600 MG PO TABS: 600.0000 mg | ORAL_TABLET | Freq: Four times a day (QID) | ORAL | 0 refills | Status: DC | PRN

## 2016-01-02 MED ORDER — PREDNISONE 20 MG PO TABS: ORAL_TABLET | ORAL | 0 refills | Status: DC

## 2016-01-02 MED ORDER — HYDROCODONE-ACETAMINOPHEN 5-325 MG PO TABS: 1.0000 | ORAL_TABLET | Freq: Four times a day (QID) | ORAL | 0 refills | Status: DC | PRN

## 2016-01-02 NOTE — ED Provider Notes (Signed)
CSN: [id number]     Arrival date & time 01/02/16  0759 History   First MD Initiated Contact with Patient 01/02/16 [id number]     Chief Complaint  Patient presents with  . Tailbone Pain   (Consider location/radiation/quality/duration/timing/severity/associated sxs/prior Treatment) HPI Patricia Humphrey is a 40 y.o. female presenting to UC with c/o buttock pain that started Friday, 9/15.  Pt states she was painting her daughter's room and hit her tailbone on the corner of a nightstand while bending down to paint the baseboards.  Pain is aching and sore, worse with certain seated positions or palpation, 6/10.  Today, she noticed a "bump" on her tailbone and mild numbness/tingling sensation of her Left thigh.  Hx of sciatica in Right leg but states this feels different as it is not a "straight line" of pain.  Denies other injuries. Denies wound to area. She has been able to use the bathroom w/o difficulty.    Past Medical History:  Diagnosis Date  . Breast cancer ([place])   . Renal disorder    Past Surgical History:  Procedure Laterality Date  . ABDOMINAL HYSTERECTOMY    . APPENDECTOMY    . BREAST SURGERY    . INCONTINENCE SURGERY     Family History  Problem Relation Age of Onset  . Cancer Father    Social History  Substance Use Topics  . Smoking status: Former Research scientist (life sciences)  . Smokeless tobacco: Never Used  . Alcohol use Yes   OB History    No data available     Review of Systems  Gastrointestinal: Negative for abdominal pain, anal bleeding, blood in stool, constipation, diarrhea, nausea and rectal pain.  Genitourinary: Negative for dysuria, frequency and urgency.  Musculoskeletal: Positive for back pain (buttock/tailbone). Negative for gait problem and myalgias.  Skin: Negative for color change and wound.  Neurological: Positive for numbness (Left thigh). Negative for weakness.    Allergies  Bactrim [sulfamethoxazole-trimethoprim]; Penicillins; and Sulfa antibiotics  Home Medications    Prior to Admission medications   Medication Sig Start Date End Date Taking? Authorizing Provider  HYDROcodone-acetaminophen (NORCO/VICODIN) 5-325 MG tablet Take 1-2 tablets by mouth every 6 (six) hours as needed. 01/02/16   Noland Fordyce, PA-C  ibuprofen (ADVIL,MOTRIN) 600 MG tablet Take 1 tablet (600 mg total) by mouth every 6 (six) hours as needed. 01/02/16   Noland Fordyce, PA-C  metroNIDAZOLE (METROCREAM) 0.75 % cream Apply topically 2 (two) times daily. After washing face 06/28/15   Noland Fordyce, PA-C  predniSONE (DELTASONE) 20 MG tablet 3 tabs po daily x 3 days, then 2 tabs x 3 days, then 1.5 tabs x 3 days, then 1 tab x 3 days, then 0.5 tabs x 3 days 01/02/16   Noland Fordyce, PA-C  tamoxifen (NOLVADEX) 20 MG tablet Take 20 mg by mouth daily.    Historical Provider, MD   Meds Ordered and Administered this Visit  Medications - No data to display  BP 112/75 (BP Location: Left Arm)   Pulse 78   Temp 98 F (36.7 C) (Oral)   Resp 16   Ht 5' 4.5" (1.638 m)   Wt 139 lb (63 kg)   SpO2 100%   BMI 23.49 kg/m  No data found.   Physical Exam  Constitutional: She is oriented to person, place, and time. She appears well-developed and well-nourished. No distress.  HENT:  Head: Normocephalic and atraumatic.  Eyes: EOM are normal.  Neck: Normal range of motion.  Cardiovascular: Normal rate.   Pulmonary/Chest: Effort normal.  Genitourinary:  Genitourinary Comments: Tenderness over coccyx. Hard nodule at top of gluteal cleft, ridged as bone. Tender. Skin in tact, no ecchymosis or erythema.   Musculoskeletal: Normal range of motion.  Tenderness over distal sacrum, over coccyx. Full ROM upper and lower extremities. Normal gait. Negative straight leg raise.   Neurological: She is alert and oriented to person, place, and time.  Reflex Scores:      Patellar reflexes are 2+ on the right side and 2+ on the left side. Skin: Skin is warm and dry. She is not diaphoretic. No erythema.  Psychiatric:  She has a normal mood and affect. Her behavior is normal.  Nursing note and vitals reviewed.   Urgent Care Course   Clinical Course    Procedures (including critical care time)  Labs Review Labs Reviewed - No data to display  Imaging Review Dg Sacrum/coccyx  Result Date: 01/02/2016 CLINICAL DATA:  Fall with tailbone tenderness.  Initial encounter. EXAM: SACRUM AND COCCYX - 2+ VIEW COMPARISON:  None. FINDINGS: No visible fracture or sacroiliac diastasis. Alignment at the sacrococcygeal level is within normal limits. Changes of bladder suspension. IMPRESSION: Negative. Electronically Signed   By: Monte Fantasia M.D.   On: 01/02/2016 08:47     MDM   1. Tailbone injury, initial encounter    Tenderness over sacrum and coccyx. No red flag symptoms.    Plain films: Negative for fracture or diastasis.  Reassured pt. Encouraged to keep using pillow or doughnut for padded seating.  Rx: Norco, ibuprofen and prednisone (per pt request as it has helped in the past with sciatic pain) Encouraged to use OTC stool softener while taking the norco for severe pain. F/u with Sports Medicine in 1-2 weeks if not improving, sooner if worsening. Discussed symptoms that warrant emergent care in the ED. Patient verbalized understanding and agreement with treatment plan.     Noland Fordyce, PA-C 01/02/16 [phone]

## 2016-01-02 NOTE — Discharge Instructions (Signed)
°  Norco/Vicodin (hydrocodone-acetaminophen) is a narcotic pain medication, do not combine these medications with others containing tylenol. While taking, do not drink alcohol, drive, or perform any other activities that requires focus while taking these medications.  ° °

## 2016-01-02 NOTE — ED Triage Notes (Signed)
Pt c/o tail bone pain x 4 days after hitting it on the corner of her daughters nightstand. She reports a knot in the area now with LT leg numbness and tingling. No OTC meds.

## 2017-11-17 ENCOUNTER — Emergency Department
Admission: EM | Admit: 2017-11-17 | Discharge: 2017-11-17 | Disposition: A | Discharge status: Home / Self Care | Payer: BLUE CROSS/BLUE SHIELD | Source: Home / Self Care | Attending: Family Medicine | Admitting: Family Medicine

## 2017-11-17 ENCOUNTER — Other Ambulatory Visit: Payer: Self-pay

## 2017-11-17 ENCOUNTER — Encounter: Payer: Self-pay | Admitting: *Deleted

## 2017-11-17 DIAGNOSIS — J040 Acute laryngitis: Secondary | ICD-10-CM

## 2017-11-17 DIAGNOSIS — J069 Acute upper respiratory infection, unspecified: Secondary | ICD-10-CM | POA: Diagnosis not present

## 2017-11-17 DIAGNOSIS — B9789 Other viral agents as the cause of diseases classified elsewhere: Secondary | ICD-10-CM

## 2017-11-17 MED ORDER — AZITHROMYCIN 250 MG PO TABS: 250.0000 mg | ORAL_TABLET | Freq: Every day | ORAL | 0 refills | Status: AC

## 2017-11-17 MED ORDER — BENZONATATE 100 MG PO CAPS: 100.0000 mg | ORAL_CAPSULE | Freq: Three times a day (TID) | ORAL | 0 refills | Status: AC

## 2017-11-17 MED ORDER — GUAIFENESIN ER 600 MG PO TB12: 600.0000 mg | ORAL_TABLET | Freq: Two times a day (BID) | ORAL | 0 refills | Status: AC | PRN

## 2017-11-17 NOTE — ED Provider Notes (Signed)
[facility]    CSN: [id number] Arrival date & time: 11/17/17  1016     History   Chief Complaint Chief Complaint  Patient presents with  . Cough  . Otalgia  . Nasal Congestion    HPI Malissie Musgrave is a 42 y.o. female.   HPI  Sheril Hammond is a 42 y.o. female presenting to UC with c/o 3 days of productive cough, chest tightness, hoarse voice, nasal congestion and ear pain. She went to a [facility] the first day due to having her son's wedding the following day. She was prescribed a lidocaine gargle but states she spit it all up.  Denies relief.  She has also tried OTC benadryl without relief.  Denies fever, chills, n/v/d. She is concerned the cough is worsening and believes she needs antibiotics. Her husband had a cold about 1 week ago. No other sick contacts.    Past Medical History:  Diagnosis Date  . Breast cancer ([place])   . Renal disorder     There are no active problems to display for this patient.   Past Surgical History:  Procedure Laterality Date  . ABDOMINAL HYSTERECTOMY    . APPENDECTOMY    . BREAST SURGERY    . INCONTINENCE SURGERY      OB History   None      Home Medications    Prior to Admission medications   Medication Sig Start Date End Date Taking? Authorizing Provider  azithromycin (ZITHROMAX) 250 MG tablet Take 1 tablet (250 mg total) by mouth daily. Take first 2 tablets together, then 1 every day until finished. 11/17/17   Noe Gens, PA-C  benzonatate (TESSALON) 100 MG capsule Take 1-2 capsules (100-200 mg total) by mouth every 8 (eight) hours. 11/17/17   Noe Gens, PA-C  guaiFENesin (MUCINEX) 600 MG 12 hr tablet Take 1-2 tablets (600-1,200 mg total) by mouth 2 (two) times daily as needed for cough or to loosen phlegm. Take with large glass of water 11/17/17   Noe Gens, PA-C    Family History Family History  Problem Relation Age of Onset  . Cancer Father     Social History Social History   Tobacco Use  .  Smoking status: Former Research scientist (life sciences)  . Smokeless tobacco: Never Used  Substance Use Topics  . Alcohol use: Yes  . Drug use: No     Allergies   Bactrim [sulfamethoxazole-trimethoprim]; Penicillins; and Sulfa antibiotics   Review of Systems Review of Systems  Constitutional: Negative for chills and fever.  HENT: Positive for congestion, ear pain, sore throat and voice change. Negative for sneezing and trouble swallowing.   Respiratory: Positive for cough and chest tightness. Negative for shortness of breath and wheezing.   Gastrointestinal: Negative for diarrhea, nausea and vomiting.  Neurological: Negative for headaches.     Physical Exam Triage Vital Signs ED Triage Vitals  Enc Vitals Group     BP 11/17/17 1032 123/89     Pulse Rate 11/17/17 1032 83     Resp 11/17/17 1032 16     Temp 11/17/17 1032 98.1 F (36.7 C)     Temp Source 11/17/17 1032 Oral     SpO2 11/17/17 1032 100 %     Weight 11/17/17 1033 146 lb (66.2 kg)     Height --      Head Circumference --      Peak Flow --      Pain Score 11/17/17 1033 5  Pain Loc --      Pain Edu? --      Excl. in [place]? --    No data found.  Updated Vital Signs BP 123/89 (BP Location: Right Arm)   Pulse 83   Temp 98.1 F (36.7 C) (Oral)   Resp 16   Wt 146 lb (66.2 kg)   SpO2 100%   BMI 24.67 kg/m   Visual Acuity Right Eye Distance:   Left Eye Distance:   Bilateral Distance:    Right Eye Near:   Left Eye Near:    Bilateral Near:     Physical Exam  Constitutional: She appears well-developed and well-nourished. No distress.  HENT:  Head: Normocephalic and atraumatic.  Right Ear: Tympanic membrane normal.  Left Ear: Tympanic membrane normal.  Nose: Mucosal edema present. Right sinus exhibits no maxillary sinus tenderness and no frontal sinus tenderness. Left sinus exhibits no maxillary sinus tenderness and no frontal sinus tenderness.  Mouth/Throat: Uvula is midline, oropharynx is clear and moist and mucous membranes  are normal.  Eyes: Conjunctivae are normal. No scleral icterus.  Neck: Normal range of motion. Neck supple.  Mildly hoarse voice but no stridor  Cardiovascular: Normal rate and regular rhythm.  Pulmonary/Chest: Effort normal and breath sounds normal. No stridor. No respiratory distress. She has no wheezes. She has no rales.  Musculoskeletal: Normal range of motion.  Lymphadenopathy:    She has no cervical adenopathy.  Neurological: She is alert.  Skin: Skin is warm and dry. She is not diaphoretic.  Nursing note and vitals reviewed.    UC Treatments / Results  Labs (all labs ordered are listed, but only abnormal results are displayed) Labs Reviewed - No data to display  EKG None  Radiology No results found.  Procedures Procedures (including critical care time)  Medications Ordered in UC Medications - No data to display  Initial Impression / Assessment and Plan / UC Course  I have reviewed the triage vital signs and the nursing notes.  Pertinent labs & imaging results that were available during my care of the patient were reviewed by me and considered in my medical decision making (see chart for details).     Hx and exam c/w viral URI, Encouraged symptomatic treatment. Prescription to hold with expiration date for azithromycin. Pt to fill if persistent fever develops or not improving in 1 week.   Final Clinical Impressions(s) / UC Diagnoses   Final diagnoses:  Viral URI with cough  Laryngitis     Discharge Instructions      Your symptoms are likely due to a virus such as the common cold, however, if you developing worsening chest congestion with shortness of breath, persistent fever (>100.4*F) for 3 days, or symptoms not improving in 4-5 days, you may fill the antibiotic (azithromycin).  If you do fill the antibiotic,  please take antibiotics as prescribed and be sure to complete entire course even if you start to feel better to ensure infection does not come  back.     ED Prescriptions    Medication Sig Dispense Auth. Provider   benzonatate (TESSALON) 100 MG capsule Take 1-2 capsules (100-200 mg total) by mouth every 8 (eight) hours. 21 capsule Gerarda Fraction, Dmitry Macomber O, PA-C   azithromycin (ZITHROMAX) 250 MG tablet Take 1 tablet (250 mg total) by mouth daily. Take first 2 tablets together, then 1 every day until finished. 6 tablet Noe Gens, PA-C   guaiFENesin (MUCINEX) 600 MG 12 hr tablet Take 1-2 tablets (600-1,200  mg total) by mouth 2 (two) times daily as needed for cough or to loosen phlegm. Take with large glass of water 30 tablet Noe Gens, PA-C     Controlled Substance Prescriptions [place] Controlled Substance Registry consulted? Not Applicable   Tyrell Antonio 11/17/17 1124

## 2017-11-17 NOTE — Discharge Instructions (Signed)
°  Your symptoms are likely due to a virus such as the common cold, however, if you developing worsening chest congestion with shortness of breath, persistent fever (> 100.4*F) for 3 days, or symptoms not improving in 4-5 days, you may fill the antibiotic (azithromycin).  If you do fill the antibiotic,  please take antibiotics as prescribed and be sure to complete entire course even if you start to feel better to ensure infection does not come back. ° °

## 2017-11-17 NOTE — ED Triage Notes (Signed)
Patient c/o 3 days of productive cough, nasal congestin and ear pain. Afebrile.

## 2017-12-29 DIAGNOSIS — E041 Nontoxic single thyroid nodule: Secondary | ICD-10-CM | POA: Insufficient documentation

## 2017-12-29 DIAGNOSIS — Z853 Personal history of malignant neoplasm of breast: Secondary | ICD-10-CM | POA: Insufficient documentation

## 2018-02-23 ENCOUNTER — Emergency Department (INDEPENDENT_AMBULATORY_CARE_PROVIDER_SITE_OTHER): Payer: BLUE CROSS/BLUE SHIELD

## 2018-02-23 ENCOUNTER — Emergency Department
Admission: EM | Admit: 2018-02-23 | Discharge: 2018-02-23 | Disposition: A | Discharge status: Home / Self Care | Payer: BLUE CROSS/BLUE SHIELD | Source: Home / Self Care | Attending: Family Medicine | Admitting: Family Medicine

## 2018-02-23 ENCOUNTER — Other Ambulatory Visit: Payer: Self-pay

## 2018-02-23 DIAGNOSIS — R05 Cough: Secondary | ICD-10-CM | POA: Diagnosis not present

## 2018-02-23 DIAGNOSIS — R053 Chronic cough: Secondary | ICD-10-CM

## 2018-02-23 MED ORDER — PREDNISONE 20 MG PO TABS: ORAL_TABLET | ORAL | 0 refills | Status: DC

## 2018-02-23 MED ORDER — MONTELUKAST SODIUM 10 MG PO TABS: 10.0000 mg | ORAL_TABLET | Freq: Every day | ORAL | 0 refills | Status: AC

## 2018-02-23 NOTE — ED Triage Notes (Signed)
Has been having a cough x 2 months.  Has seen 3 MDs.  Not getting any better.

## 2018-02-23 NOTE — ED Provider Notes (Signed)
[facility]    CSN: [id number] Arrival date & time: 02/23/18  1358     History   Chief Complaint Chief Complaint  Patient presents with  . Cough    HPI Patricia Humphrey is a 42 y.o. female.   HPI  Patricia Humphrey is a 42 y.o. female presenting to UC with c/o mildly productive cough for 2 months. She was tx with azithromycin, tessalon, and guaifenesin on 11/17/17 where she was seen at [facility]. Cough never completely resolved. She notes she has seen 3 providers for same cough and has been told it is allergies. Hx of "seasonal asthma" she has tried her inhaler along with OTC cough/cold medications and allergy medicine w/o relief. Mild scratchy throat from post-nasal drip. Denies taking any daily medications. Denies fever, chills, n/v/d.    Past Medical History:  Diagnosis Date  . Breast cancer ([place])   . Renal disorder     There are no active problems to display for this patient.   Past Surgical History:  Procedure Laterality Date  . ABDOMINAL HYSTERECTOMY    . APPENDECTOMY    . BREAST SURGERY    . INCONTINENCE SURGERY      OB History   None      Home Medications    Prior to Admission medications   Medication Sig Start Date End Date Taking? Authorizing Provider  azithromycin (ZITHROMAX) 250 MG tablet Take 1 tablet (250 mg total) by mouth daily. Take first 2 tablets together, then 1 every day until finished. 11/17/17   Noe Gens, PA-C  benzonatate (TESSALON) 100 MG capsule Take 1-2 capsules (100-200 mg total) by mouth every 8 (eight) hours. 11/17/17   Noe Gens, PA-C  guaiFENesin (MUCINEX) 600 MG 12 hr tablet Take 1-2 tablets (600-1,200 mg total) by mouth 2 (two) times daily as needed for cough or to loosen phlegm. Take with large glass of water 11/17/17   Noe Gens, PA-C  montelukast (SINGULAIR) 10 MG tablet Take 1 tablet (10 mg total) by mouth at bedtime. 02/23/18   Noe Gens, PA-C  predniSONE (DELTASONE) 20 MG tablet 3 tabs po day one, then 2 po  daily x 4 days 02/23/18   Noe Gens, PA-C    Family History Family History  Problem Relation Age of Onset  . Cancer Father     Social History Social History   Tobacco Use  . Smoking status: Former Research scientist (life sciences)  . Smokeless tobacco: Never Used  Substance Use Topics  . Alcohol use: Yes  . Drug use: No     Allergies   Bactrim [sulfamethoxazole-trimethoprim]; Penicillins; and Sulfa antibiotics   Review of Systems Review of Systems  Constitutional: Negative for chills, diaphoresis, fatigue and fever.  HENT: Positive for congestion, ear pain ("full" feeling), postnasal drip and sore throat (scratchy). Negative for ear discharge, sinus pressure and sinus pain.   Respiratory: Positive for cough. Negative for chest tightness, shortness of breath and wheezing.   Skin: Negative for rash.  Neurological: Negative for dizziness, light-headedness and headaches.     Physical Exam Triage Vital Signs ED Triage Vitals  Enc Vitals Group     BP 02/23/18 1412 137/86     Pulse Rate 02/23/18 1412 83     Resp 02/23/18 1412 18     Temp 02/23/18 1412 98 F (36.7 C)     Temp Source 02/23/18 1412 Oral     SpO2 02/23/18 1412 98 %     Weight 02/23/18 1414 143 lb (  64.9 kg)     Height 02/23/18 1414 5' 4.5" (1.638 m)     Head Circumference --      Peak Flow --      Pain Score 02/23/18 1414 0     Pain Loc --      Pain Edu? --      Excl. in [place]? --    No data found.  Updated Vital Signs BP 137/86 (BP Location: Right Arm)   Pulse 83   Temp 98 F (36.7 C) (Oral)   Resp 18   Ht 5' 4.5" (1.638 m)   Wt 143 lb (64.9 kg)   SpO2 98%   BMI 24.17 kg/m   Visual Acuity Right Eye Distance:   Left Eye Distance:   Bilateral Distance:    Right Eye Near:   Left Eye Near:    Bilateral Near:     Physical Exam  Constitutional: She is oriented to person, place, and time. She appears well-developed and well-nourished.  HENT:  Head: Normocephalic and atraumatic.  Right Ear: Tympanic membrane  normal.  Left Ear: Tympanic membrane normal.  Nose: Nose normal.  Mouth/Throat: Uvula is midline, oropharynx is clear and moist and mucous membranes are normal.  Eyes: EOM are normal.  Neck: Normal range of motion. Neck supple.  Cardiovascular: Normal rate and regular rhythm.  Pulmonary/Chest: Effort normal and breath sounds normal. No stridor. No respiratory distress. She has no wheezes. She has no rales.  Musculoskeletal: Normal range of motion.  Neurological: She is alert and oriented to person, place, and time.  Skin: Skin is warm and dry.  Psychiatric: She has a normal mood and affect. Her behavior is normal.  Nursing note and vitals reviewed.    UC Treatments / Results  Labs (all labs ordered are listed, but only abnormal results are displayed) Labs Reviewed - No data to display  EKG None  Radiology Dg Chest 2 View  Result Date: 02/23/2018 CLINICAL DATA:  Chronic cough.  History of breast carcinoma EXAM: CHEST - 2 VIEW COMPARISON:  None. FINDINGS: Lungs are clear. Heart size and pulmonary vascularity are normal. No adenopathy. There is mild midthoracic dextroscoliosis with thoracolumbar levoscoliosis. Breast implants noted bilaterally. There are clips in the right axillary region. IMPRESSION: No edema or consolidation.  No evident adenopathy. Electronically Signed   By: Lowella Grip III M.D.   On: 02/23/2018 14:36    Procedures Procedures (including critical care time)  Medications Ordered in UC Medications - No data to display  Initial Impression / Assessment and Plan / UC Course  I have reviewed the triage vital signs and the nursing notes.  Pertinent labs & imaging results that were available during my care of the patient were reviewed by me and considered in my medical decision making (see chart for details).     Normal exam. Discussed imaging with pt.  Will try symptomatic treatment with prednisone and Singulair. Encouraged f/u with PCP and/or allergist  as symptoms appear allergic in nature.  Final Clinical Impressions(s) / UC Diagnoses   Final diagnoses:  Chronic cough     Discharge Instructions      Please follow up with family medicine in 1 week if symptoms not improving. You may need a referral to an allergist for further evaluation and treatment.     ED Prescriptions    Medication Sig Dispense Auth. Provider   predniSONE (DELTASONE) 20 MG tablet 3 tabs po day one, then 2 po daily x 4 days 11 tablet Gerarda Fraction,  Zaara Sprowl O, PA-C   montelukast (SINGULAIR) 10 MG tablet Take 1 tablet (10 mg total) by mouth at bedtime. 30 tablet Noe Gens, PA-C     Controlled Substance Prescriptions [place] Controlled Substance Registry consulted? Not Applicable   Tyrell Antonio 02/23/18 1547

## 2018-02-23 NOTE — Discharge Instructions (Signed)
°  Please follow up with family medicine in 1 week if symptoms not improving. You may need a referral to an allergist for further evaluation and treatment.

## 2018-04-15 DIAGNOSIS — U071 COVID-19: Secondary | ICD-10-CM

## 2018-04-15 HISTORY — DX: COVID-19: U07.1

## 2018-11-10 DIAGNOSIS — G43709 Chronic migraine without aura, not intractable, without status migrainosus: Secondary | ICD-10-CM | POA: Insufficient documentation

## 2019-08-07 ENCOUNTER — Other Ambulatory Visit: Payer: Self-pay

## 2019-08-07 ENCOUNTER — Encounter: Payer: Self-pay | Admitting: Emergency Medicine

## 2019-08-07 ENCOUNTER — Emergency Department (INDEPENDENT_AMBULATORY_CARE_PROVIDER_SITE_OTHER): Payer: BC Managed Care – PPO

## 2019-08-07 ENCOUNTER — Emergency Department
Admission: EM | Admit: 2019-08-07 | Discharge: 2019-08-07 | Disposition: A | Discharge status: Home / Self Care | Payer: BLUE CROSS/BLUE SHIELD | Source: Home / Self Care | Attending: Family Medicine | Admitting: Family Medicine

## 2019-08-07 DIAGNOSIS — M778 Other enthesopathies, not elsewhere classified: Secondary | ICD-10-CM

## 2019-08-07 DIAGNOSIS — M654 Radial styloid tenosynovitis [de Quervain]: Secondary | ICD-10-CM

## 2019-08-07 HISTORY — DX: Migraine, unspecified, not intractable, without status migrainosus: G43.909

## 2019-08-07 MED ORDER — PREDNISONE 20 MG PO TABS: ORAL_TABLET | ORAL | 0 refills | Status: AC

## 2019-08-07 NOTE — Discharge Instructions (Addendum)
Wear splint.  Apply ice pack for 20 to 30 minutes, 3 to 4 times daily  Continue until pain and swelling decrease.  May take Tylenol as needed for pain.  Begin range of motion and stretching exercises as tolerated.

## 2019-08-07 NOTE — ED Triage Notes (Signed)
Patient awoke 3 days ago with pain in right hand; today index knuckle edematous and red; no known injury but uses hand in occupation and is avid user of gym; no OTCs since 0700. No known exposure covid positive person.

## 2019-08-07 NOTE — ED Provider Notes (Signed)
[facility]    CSN: [id number] Arrival date & time: 08/07/19  1546      History   Chief Complaint Chief Complaint  Patient presents with  . Hand Pain    HPI Patricia Humphrey is a 44 y.o. female.   Patient awoke 3 days ago with pain in her right hand dorsum.  Today she had swelling also.  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  The history is provided by the patient.  Hand Pain This is a new problem. Episode onset: 3 days ago. The problem occurs constantly. The problem has been gradually worsening. Exacerbated by: extension of fingers. Nothing relieves the symptoms. She has tried nothing for the symptoms.    Past Medical History:  Diagnosis Date  . Breast cancer ([place])   . Migraines   . Renal disorder     There are no problems to display for this patient.   Past Surgical History:  Procedure Laterality Date  . ABDOMINAL HYSTERECTOMY    . APPENDECTOMY    . BREAST SURGERY    . INCONTINENCE SURGERY      OB History   No obstetric history on file.      Home Medications    Prior to Admission medications   Medication Sig Start Date End Date Taking? Authorizing Provider  topiramate (TOPAMAX) 100 MG tablet Take 100 mg by mouth 2 (two) times daily.   Yes [provider]  azithromycin (ZITHROMAX) 250 MG tablet Take 1 tablet (250 mg total) by mouth daily. Take first 2 tablets together, then 1 every day until finished. 11/17/17   Noe Gens, PA-C  benzonatate (TESSALON) 100 MG capsule Take 1-2 capsules (100-200 mg total) by mouth every 8 (eight) hours. 11/17/17   Noe Gens, PA-C  guaiFENesin (MUCINEX) 600 MG 12 hr tablet Take 1-2 tablets (600-1,200 mg total) by mouth 2 (two) times daily as needed for cough or to loosen phlegm. Take with large glass of water 11/17/17   Noe Gens, PA-C  montelukast (SINGULAIR) 10 MG tablet Take 1 tablet (10 mg total) by mouth at bedtime. 02/23/18   Noe Gens, PA-C  predniSONE (DELTASONE) 20 MG tablet Take one tab by mouth twice daily for 4 days, then one daily. Take with food. 08/07/19   Kandra Nicolas, MD    Family History Family History  Problem Relation Age of Onset  . Cancer Father     Social History Social History   Tobacco Use  . Smoking status: Former Research scientist (life sciences)  . Smokeless tobacco: Never Used  Substance Use Topics  . Alcohol use: Yes  . Drug use: No     Allergies   Bactrim [sulfamethoxazole-trimethoprim], Penicillins, and Sulfa antibiotics   Review of Systems Review of Systems  Musculoskeletal: Positive for arthralgias and joint swelling.  Skin: Negative.   All other systems reviewed and are negative.    Physical Exam Triage Vital Signs ED Triage Vitals [08/07/19 1609]  Enc Vitals Group     BP 118/84     Pulse Rate 75     Resp 16     Temp (!) 97.4 F (36.3 C)     Temp Source Oral     SpO2 99 %     Weight 122 lb (55.3 kg)     Height 5' 4.5" (1.638 m)     Head Circumference      Peak Flow  Pain Score 7     Pain Loc      Pain Edu?      Excl. in [place]?    No data found.  Updated Vital Signs BP 118/84   Pulse 75   Temp (!) 97.4 F (36.3 C) (Oral)   Resp 16   Ht 5' 4.5" (1.638 m)   Wt 55.3 kg   SpO2 99%   BMI 20.62 kg/m   Visual Acuity Right Eye Distance:   Left Eye Distance:   Bilateral Distance:    Right Eye Near:   Left Eye Near:    Bilateral Near:     Physical Exam Vitals and nursing note reviewed.  Constitutional:      General: She is not in acute distress. HENT:     Head: Normocephalic.  Eyes:     Pupils: Pupils are equal, round, and reactive to light.  Cardiovascular:     Rate and Rhythm: Normal rate.  Pulmonary:     Effort: Pulmonary effort is normal.  Musculoskeletal:     Right hand: Swelling and tenderness present. No deformity, lacerations or bony tenderness. Normal range of motion. Normal strength. Normal sensation. There is no disruption of two-point discrimination.  Normal capillary refill.       Hands:     Comments: Right hand has tenderness to palpation over the extensor pollicis brevis and longus tendons.  There is also tenderness to palpation over the extensor carpi ulnaris tendon.  Skin:    General: Skin is warm and dry.  Neurological:     Mental Status: She is alert.      UC Treatments / Results  Labs (all labs ordered are listed, but only abnormal results are displayed) Labs Reviewed - No data to display  EKG   Radiology DG Hand Complete Right  Result Date: 08/07/2019 CLINICAL DATA:  Right wrist pain and swelling for 4 days, metacarpal pain, possible overuse injury EXAM: RIGHT HAND - COMPLETE 3+ VIEW COMPARISON:  None. FINDINGS: Frontal, oblique, lateral views of the right hand demonstrate no fractures. Alignment is anatomic. Joint spaces are well preserved. Soft tissues are normal. IMPRESSION: 1. Unremarkable right hand. Electronically Signed   By: Randa Ngo M.D.   On: 08/07/2019 16:27    Procedures Procedures (including critical care time)  Medications Ordered in UC Medications - No data to display  Initial Impression / Assessment and Plan / UC Course  I have reviewed the triage vital signs and the nursing notes.  Pertinent labs & imaging results that were available during my care of the patient were reviewed by me and considered in my medical decision making (see chart for details).    Dispensed thumb spica splint.  Begin prednisone burst/taper. Given exercise treatment instructions with range of motion and stretching exercises. Followup with Dr. Aundria Mems ([facility]) if not improving about two weeks.     Final Clinical Impressions(s) / UC Diagnoses   Final diagnoses:  Right wrist tendonitis  Tenosynovitis, de Quervain     Discharge Instructions     Wear splint.  Apply ice pack for 20 to 30 minutes, 3 to 4 times daily  Continue until pain and swelling decrease.  May take Tylenol as needed  for pain.  Begin range of motion and stretching exercises as tolerated.    ED Prescriptions    Medication Sig Dispense Auth. Provider   predniSONE (DELTASONE) 20 MG tablet Take one tab by mouth twice daily for 4 days, then one daily. Take  with food. 12 tablet Kandra Nicolas, MD        Kandra Nicolas, MD 08/09/19 [phone]

## 2020-12-01 IMAGING — DX DG HAND COMPLETE 3+V*R*
3 series · 3 of 3 positions shown · non-contrast
Comparison: None.

CLINICAL DATA: Right wrist pain and swelling for 4 days, metacarpal
pain, possible overuse injury

EXAM:
RIGHT HAND - COMPLETE 3+ VIEW

[hand pa]
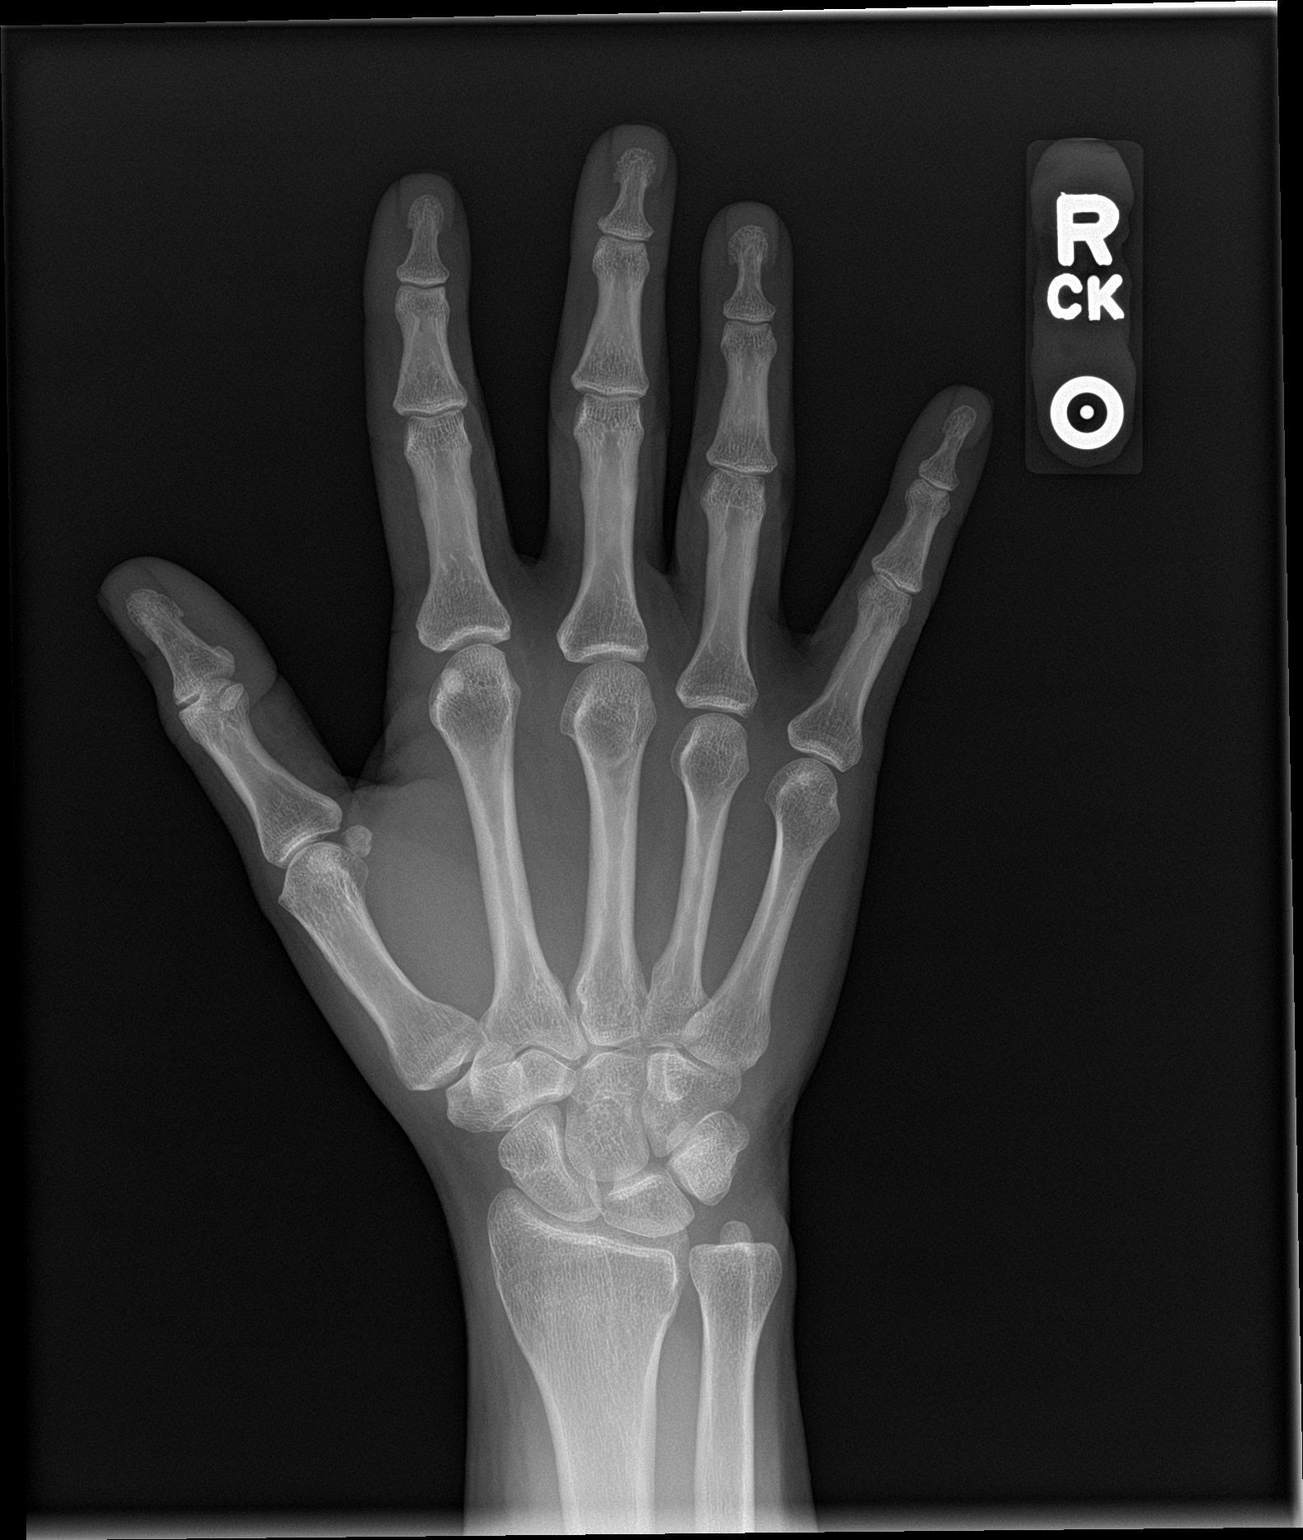

[hand obl]
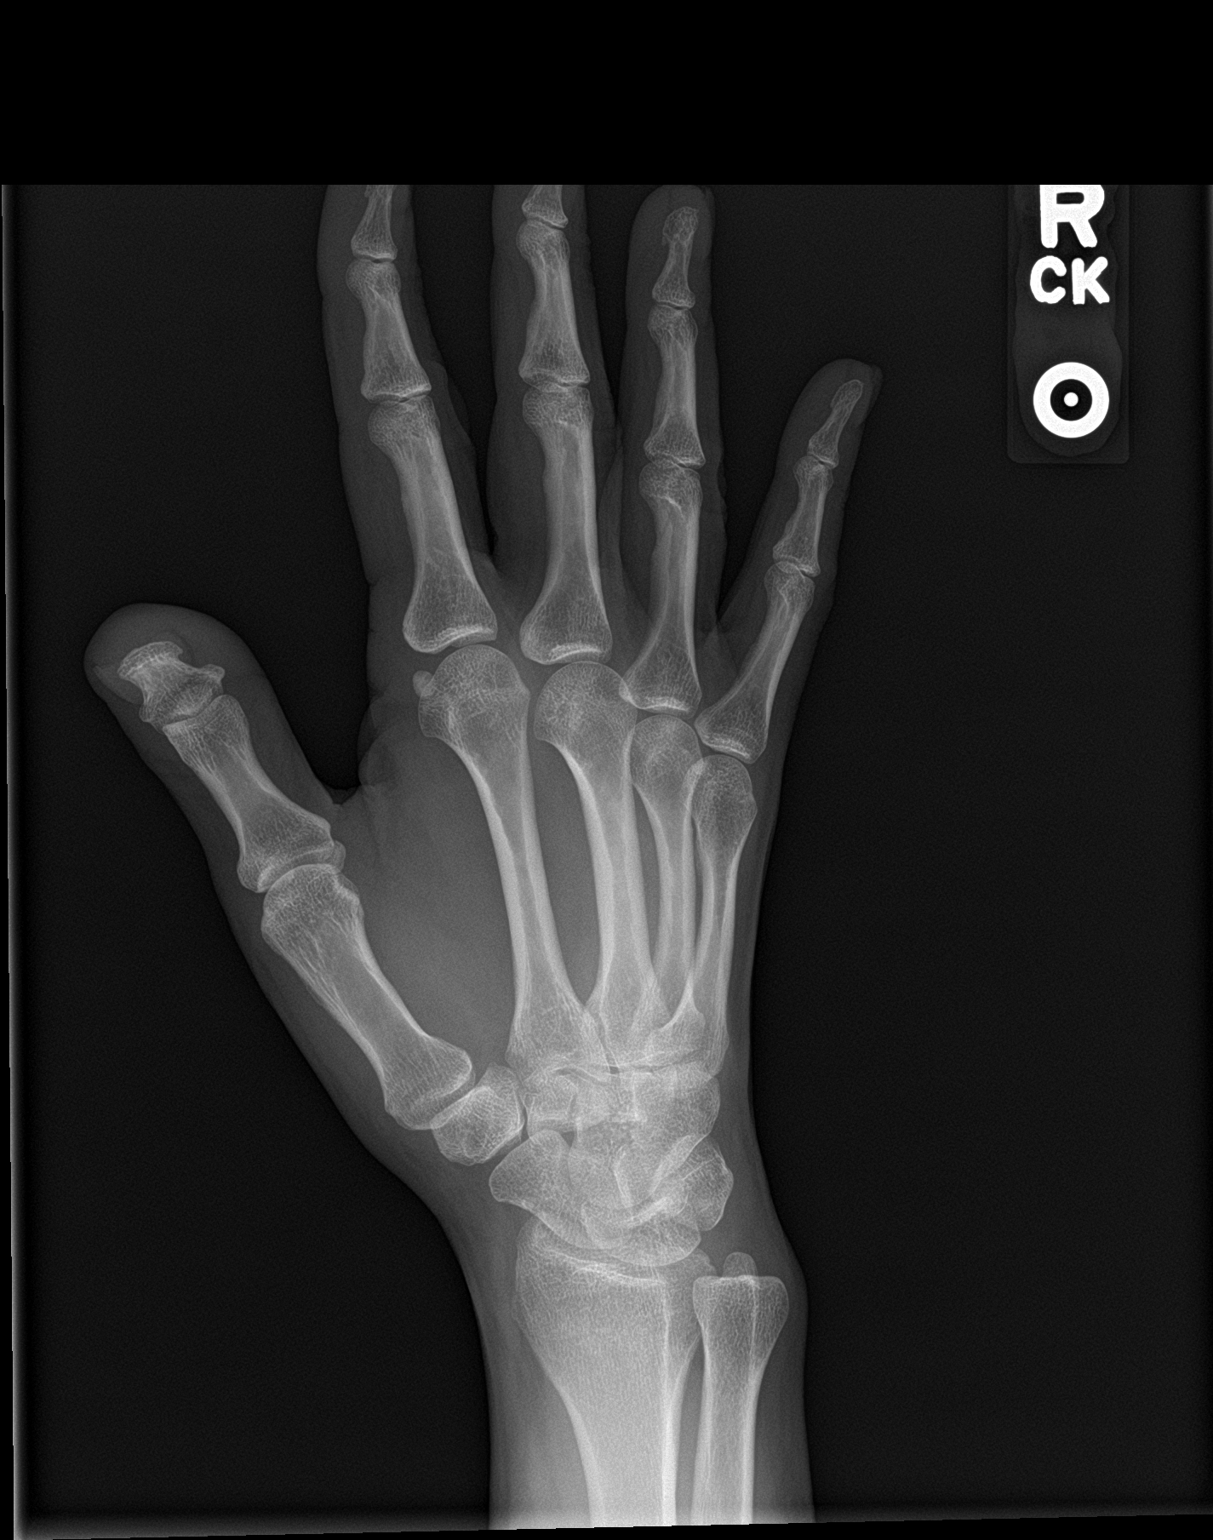

[hand lat]
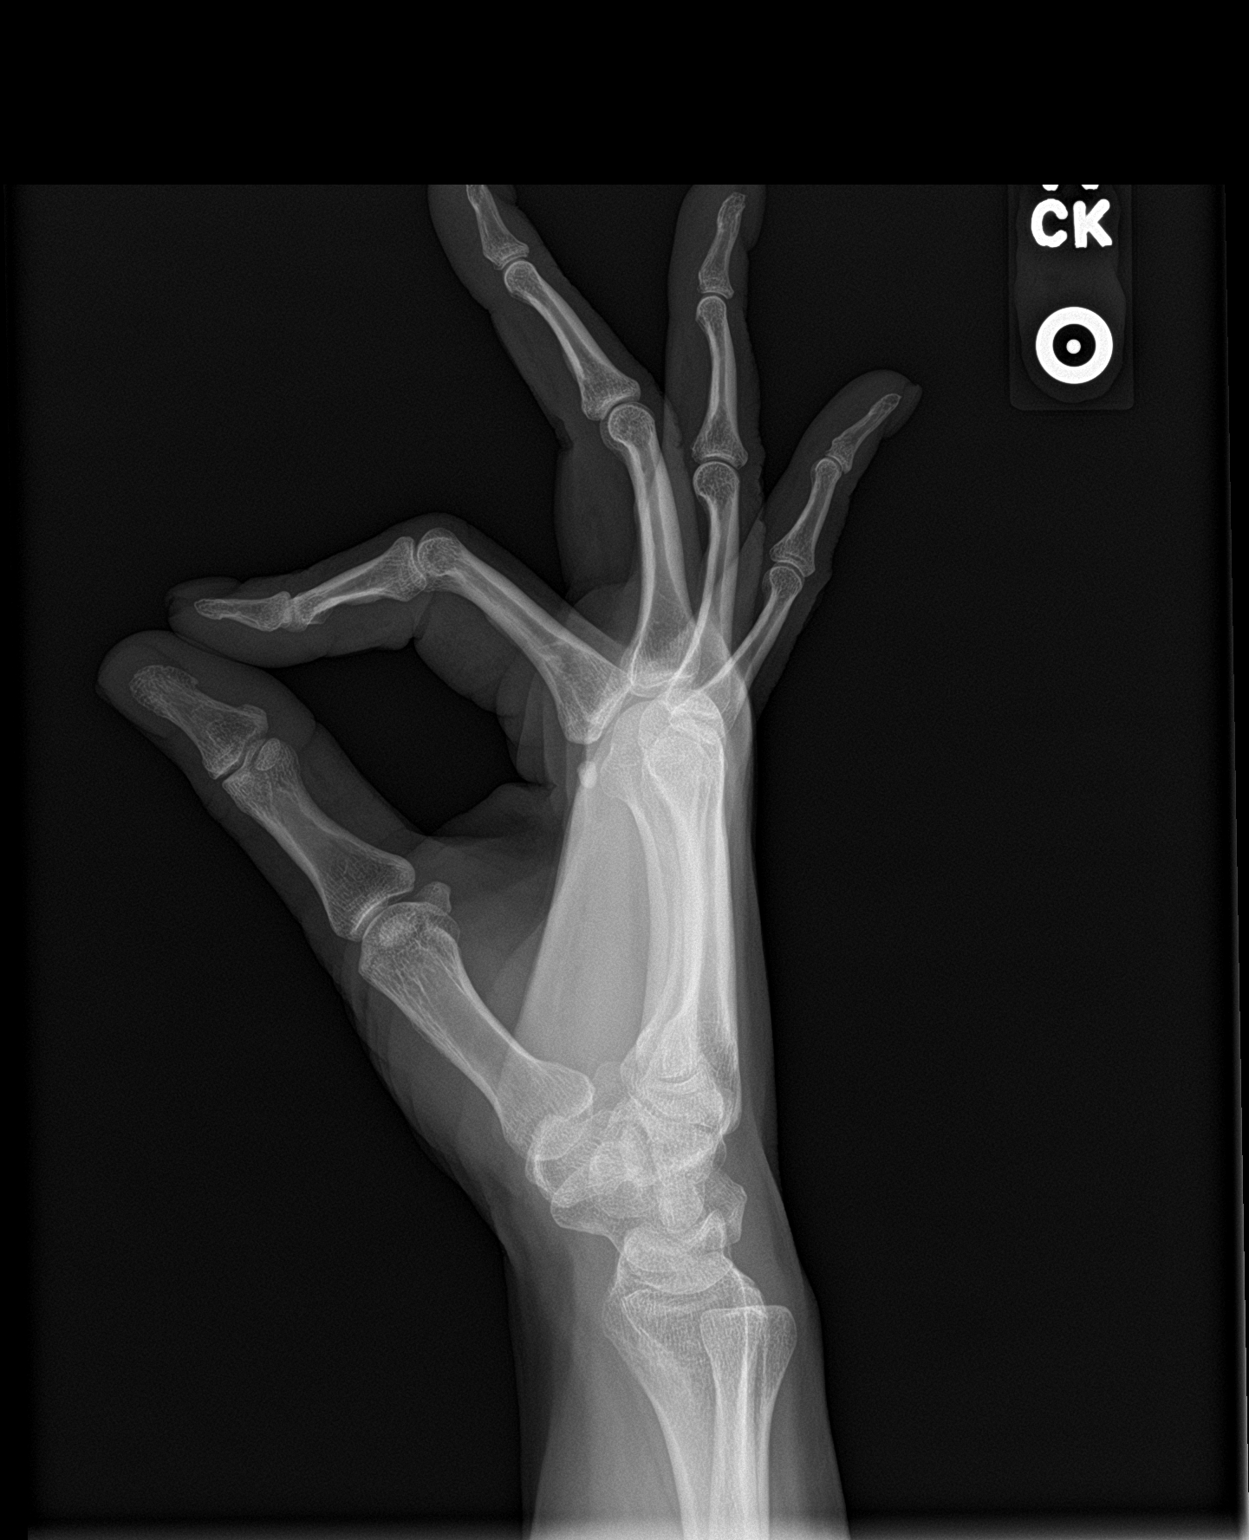

[3 of 3 positions shown; findings below may reference images not displayed]

FINDINGS: Frontal, oblique, lateral views of the right hand demonstrate no
fractures. Alignment is anatomic. Joint spaces are well preserved.
Soft tissues are normal.
IMPRESSION: 1. Unremarkable right hand.

## 2020-12-09 ENCOUNTER — Encounter: Payer: Self-pay | Admitting: Emergency Medicine

## 2020-12-09 ENCOUNTER — Emergency Department
Admission: EM | Admit: 2020-12-09 | Discharge: 2020-12-09 | Disposition: A | Discharge status: Home / Self Care | Payer: BC Managed Care – PPO | Source: Home / Self Care

## 2020-12-09 ENCOUNTER — Other Ambulatory Visit: Payer: Self-pay

## 2020-12-09 DIAGNOSIS — N83209 Unspecified ovarian cyst, unspecified side: Secondary | ICD-10-CM | POA: Insufficient documentation

## 2020-12-09 DIAGNOSIS — J309 Allergic rhinitis, unspecified: Secondary | ICD-10-CM | POA: Diagnosis not present

## 2020-12-09 DIAGNOSIS — R509 Fever, unspecified: Secondary | ICD-10-CM

## 2020-12-09 DIAGNOSIS — J029 Acute pharyngitis, unspecified: Secondary | ICD-10-CM | POA: Diagnosis not present

## 2020-12-09 MED ORDER — FEXOFENADINE HCL 180 MG PO TABS: 180.0000 mg | ORAL_TABLET | Freq: Every day | ORAL | 0 refills | Status: AC

## 2020-12-09 NOTE — Discharge Instructions (Addendum)
Advised patient to take medication as directed for the next 5-7 days, then as needed.  Advised conservative measures for now, alternate OTC Tylenol 1000 mg 1-2 times daily, as needed with OTC ibuprofen 600 mg 1-2 times daily, as needed.  Encourage patient increase daily water intake while taking these medications.

## 2020-12-09 NOTE — ED Triage Notes (Signed)
Sinus drainage since Thursday Ibuprofen at 1230  Body aches  Dry cough Negative home covid test yesterday COVID 2020 No COVID vaccine

## 2020-12-09 NOTE — ED Provider Notes (Signed)
[facility]    CSN: [id number] Arrival date & time: 12/09/20  1457      History   Chief Complaint Chief Complaint  Patient presents with   Sore Throat   Generalized Body Aches    HPI Patricia Humphrey is a 45 y.o. female.   HPI 45 year old female presents with sore throat, body aches, dry cough and sinus drainage for 2 days.  Patient reports having Jay in 2020 and is not vaccinated for COVID-19.  Past Medical History:  Diagnosis Date   Breast cancer ([place])    COVID 2020   Migraines    Renal disorder     Patient Active Problem List   Diagnosis Date Noted   Cyst of ovary 12/09/2020   Chronic migraine without aura without status migrainosus, not intractable 11/10/2018   History of right breast cancer 12/29/2017   Thyroid nodule 12/29/2017   Primary osteoarthritis of left knee 06/26/2015   Breast cancer ([place]) 02/03/2013    Past Surgical History:  Procedure Laterality Date   ABDOMINAL HYSTERECTOMY     APPENDECTOMY     BREAST SURGERY     INCONTINENCE SURGERY      OB History   No obstetric history on file.      Home Medications    Prior to Admission medications   Medication Sig Start Date End Date Taking? Authorizing Provider  fexofenadine (ALLEGRA ALLERGY) 180 MG tablet Take 1 tablet (180 mg total) by mouth daily for 15 days. 12/09/20 12/24/20 Yes Eliezer Lofts, FNP  azithromycin (ZITHROMAX) 250 MG tablet Take 1 tablet (250 mg total) by mouth daily. Take first 2 tablets together, then 1 every day until finished. Patient not taking: Reported on 12/09/2020 11/17/17   Noe Gens, PA-C  benzonatate (TESSALON) 100 MG capsule Take 1-2 capsules (100-200 mg total) by mouth every 8 (eight) hours. Patient not taking: Reported on 12/09/2020 11/17/17   Noe Gens, PA-C  Galcanezumab-gnlm [facility]) 120 MG/ML SOAJ Emgality Pen 120 mg/mL subcutaneous pen injector    [provider]  guaiFENesin (MUCINEX) 600 MG 12 hr tablet Take 1-2 tablets (600-1,200  mg total) by mouth 2 (two) times daily as needed for cough or to loosen phlegm. Take with large glass of water Patient not taking: Reported on 12/09/2020 11/17/17   Noe Gens, PA-C  montelukast (SINGULAIR) 10 MG tablet Take 1 tablet (10 mg total) by mouth at bedtime. 02/23/18   Noe Gens, PA-C  predniSONE (DELTASONE) 20 MG tablet Take one tab by mouth twice daily for 4 days, then one daily. Take with food. Patient not taking: Reported on 12/09/2020 08/07/19   Kandra Nicolas, MD  topiramate (TOPAMAX) 100 MG tablet Take 100 mg by mouth 2 (two) times daily.    [provider]    Family History Family History  Problem Relation Age of Onset   Healthy Mother    Cancer Father     Social History Social History   Tobacco Use   Smoking status: Former   Smokeless tobacco: Never  Substance Use Topics   Alcohol use: Not Currently   Drug use: No     Allergies   Nitrofurantoin, Bactrim [sulfamethoxazole-trimethoprim], Penicillins, and Sulfa antibiotics   Review of Systems Review of Systems  Constitutional:  Positive for fatigue and fever.  HENT:  Positive for postnasal drip and sore throat.   Musculoskeletal:  Positive for myalgias.  All other systems reviewed and are negative.   Physical Exam Triage Vital Signs ED Triage  Vitals  Enc Vitals Group     BP 12/09/20 1512 110/75     Pulse Rate 12/09/20 1512 80     Resp 12/09/20 1512 15     Temp 12/09/20 1512 99.4 F (37.4 C)     Temp Source 12/09/20 1512 Oral     SpO2 12/09/20 1512 97 %     Weight 12/09/20 1513 110 lb (49.9 kg)     Height 12/09/20 1513 5' 4.5" (1.638 m)     Head Circumference --      Peak Flow --      Pain Score 12/09/20 1513 4     Pain Loc --      Pain Edu? --      Excl. in [employer]? --    No data found.  Updated Vital Signs BP 110/75 (BP Location: Left Arm) Comment: no BP R arm  Pulse 80   Temp 99.4 F (37.4 C) (Oral)   Resp 15   Ht 5' 4.5" (1.638 m)   Wt 110 lb (49.9 kg)   SpO2 97%   BMI  18.59 kg/m    Physical Exam Constitutional:      General: She is not in acute distress.    Appearance: Normal appearance. She is normal weight. She is not ill-appearing.  HENT:     Head: Normocephalic and atraumatic.     Right Ear: Tympanic membrane, ear canal and external ear normal.     Left Ear: Tympanic membrane, ear canal and external ear normal.     Mouth/Throat:     Mouth: Mucous membranes are moist.     Pharynx: Oropharynx is clear. Posterior oropharyngeal erythema present.     Comments: Moderate to significant amount of clear drainage of posterior oropharynx noted Eyes:     Extraocular Movements: Extraocular movements intact.     Conjunctiva/sclera: Conjunctivae normal.     Pupils: Pupils are equal, round, and reactive to light.  Cardiovascular:     Rate and Rhythm: Normal rate and regular rhythm.     Pulses: Normal pulses.     Heart sounds: Normal heart sounds. No murmur heard. Pulmonary:     Effort: Pulmonary effort is normal.     Breath sounds: Normal breath sounds. No wheezing, rhonchi or rales.  Musculoskeletal:        General: Normal range of motion.     Cervical back: Normal range of motion and neck supple. No tenderness.  Lymphadenopathy:     Cervical: No cervical adenopathy.  Skin:    General: Skin is warm and dry.  Neurological:     General: No focal deficit present.     Mental Status: She is alert and oriented to person, place, and time. Mental status is at baseline.  Psychiatric:        Mood and Affect: Mood normal.        Behavior: Behavior normal.        Thought Content: Thought content normal.     UC Treatments / Results  Labs (all labs ordered are listed, but only abnormal results are displayed) Labs Reviewed - No data to display  EKG   Radiology No results found.  Procedures Procedures (including critical care time)  Medications Ordered in UC Medications - No data to display  Initial Impression / Assessment and Plan / UC Course   I have reviewed the triage vital signs and the nursing notes.  Pertinent labs & imaging results that were available during my care of the patient were  reviewed by me and considered in my medical decision making (see chart for details).     MDM: 1.  Fever, unspecified-Advised conservative measures for now, alternate OTC Tylenol 1000 mg 1-2 times daily, as needed with OTC ibuprofen 600 mg 1-2 times daily, as needed.  Encourage patient increase daily water intake while taking these medications; 2.  Viral pharyngitis-as above; 3.  Allergic rhinitis-Rx'd Allegra.  Patient discharged home, hemodynamically stable. Final Clinical Impressions(s) / UC Diagnoses   Final diagnoses:  Fever, unspecified  Viral pharyngitis  Allergic rhinitis, unspecified seasonality, unspecified trigger     Discharge Instructions      Advised patient to take medication as directed for the next 5-7 days, then as needed.  Advised conservative measures for now, alternate OTC Tylenol 1000 mg 1-2 times daily, as needed with OTC ibuprofen 600 mg 1-2 times daily, as needed.  Encourage patient increase daily water intake while taking these medications.     ED Prescriptions     Medication Sig Dispense Auth. Provider   fexofenadine (ALLEGRA ALLERGY) 180 MG tablet Take 1 tablet (180 mg total) by mouth daily for 15 days. 15 tablet Eliezer Lofts, FNP      PDMP not reviewed this encounter.   Eliezer Lofts, [place] 12/09/20 1643

## 2020-12-11 ENCOUNTER — Ambulatory Visit: Payer: Self-pay
